# Patient Record
Sex: Female | Born: 1995 | Race: White | Hispanic: No | Marital: Married | Smoking: Never smoker
Health system: Southern US, Community
[De-identification: ages and names within clinical notes are randomized; demographics above are authoritative.]

## PROBLEM LIST (undated history)

## (undated) ENCOUNTER — Inpatient Hospital Stay: Payer: Self-pay

## (undated) DIAGNOSIS — N289 Disorder of kidney and ureter, unspecified: Secondary | ICD-10-CM

## (undated) DIAGNOSIS — N200 Calculus of kidney: Secondary | ICD-10-CM

## (undated) HISTORY — PX: NO PAST SURGERIES: SHX2092

---
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
## 2014-09-10 ENCOUNTER — Emergency Department: Payer: Self-pay | Admitting: Emergency Medicine

## 2014-09-25 NOTE — L&D Delivery Note (Addendum)
Obstetrical Delivery Note   Date of Delivery:   05/14/2015 Primary OB:   [facility] Gestational Age/EDD: [redacted]w[redacted]d (Dated by LMP=8 week ultrasoun) Antepartum complications: EFW @ 11%, AC<5%, GBS positive  Delivered By:   Cornelia Copa. MD  Delivery Type:   spontaneous vaginal delivery  Delivery Details:   Patient pushed well and with each push, decel to the 60s-70s x 1 minute with good recovery, moderate variability and back to normal baseline. Once she got to +4 and fetal heart rate stayed down x 1 minute, decision made to continue until delivery. With help of Ritgen maneuver, the fetus delivered direct OA with nuchal cord x 1 reduced after cutting the FSE and infant delivered w/o issue. Infant placed on mom's chest and due to lack of tone and cry, cord clamped and cut immediately and handed to the awaiting peds team. Placenta out via active third stage and perineum intact. Right labia minora skid mark that was bleeding so several figure of eight 3-0 vicryl rapide sutures placed for hemostasis.  Anesthesia:    epidural Intrapartum complications: ?scoliosis (epidural worked w/o issue) GBS:    Positive (she received two doses of ampicillin prior to delivery) Laceration:    Bilateral labia minora tears; only right one was repaired (see above) Episiotomy:    none Placenta:    Delivered and expressed via active management. Intact: yes. To pathology: yes.  Estimated Blood Loss:   Baby:    Liveborn female, APGARs 4/8, weight 2930gm. Per RT, unable to run venous gas, which is odd since usually it's the arterial sample that has insufficient volume but arterial showed pH of 6.99, CO2 69, bicarb 16.6, acid-base deficit 15.7  Cornelia Copa. MD [facility] Pager [phone]

## 2014-09-30 LAB — OB RESULTS CONSOLE HIV ANTIBODY (ROUTINE TESTING): HIV: NONREACTIVE

## 2014-09-30 LAB — OB RESULTS CONSOLE HEPATITIS B SURFACE ANTIGEN: Hepatitis B Surface Ag: NEGATIVE

## 2014-09-30 LAB — OB RESULTS CONSOLE ANTIBODY SCREEN: Antibody Screen: NEGATIVE

## 2014-09-30 LAB — OB RESULTS CONSOLE RPR: RPR: NONREACTIVE

## 2014-09-30 LAB — OB RESULTS CONSOLE VARICELLA ZOSTER ANTIBODY, IGG: Varicella: IMMUNE

## 2014-09-30 LAB — OB RESULTS CONSOLE ABO/RH: RH Type: POSITIVE

## 2014-09-30 LAB — OB RESULTS CONSOLE GC/CHLAMYDIA
CHLAMYDIA, DNA PROBE: NEGATIVE
Gonorrhea: NEGATIVE

## 2014-09-30 LAB — OB RESULTS CONSOLE RUBELLA ANTIBODY, IGM: RUBELLA: NON-IMMUNE/NOT IMMUNE

## 2015-02-16 ENCOUNTER — Emergency Department
Admission: EM | Admit: 2015-02-16 | Discharge: 2015-02-16 | Disposition: A | Discharge status: Home / Self Care | Payer: Medicaid Other | Attending: Emergency Medicine | Admitting: Emergency Medicine

## 2015-02-16 DIAGNOSIS — L732 Hidradenitis suppurativa: Secondary | ICD-10-CM | POA: Diagnosis not present

## 2015-02-16 DIAGNOSIS — O99712 Diseases of the skin and subcutaneous tissue complicating pregnancy, second trimester: Secondary | ICD-10-CM | POA: Insufficient documentation

## 2015-02-16 DIAGNOSIS — Z87891 Personal history of nicotine dependence: Secondary | ICD-10-CM | POA: Insufficient documentation

## 2015-02-16 DIAGNOSIS — L02419 Cutaneous abscess of limb, unspecified: Secondary | ICD-10-CM

## 2015-02-16 DIAGNOSIS — Z3A00 Weeks of gestation of pregnancy not specified: Secondary | ICD-10-CM | POA: Diagnosis not present

## 2015-02-16 DIAGNOSIS — L02411 Cutaneous abscess of right axilla: Secondary | ICD-10-CM | POA: Diagnosis not present

## 2015-02-16 MED ORDER — CLINDAMYCIN HCL 300 MG PO CAPS: 300.0000 mg | ORAL_CAPSULE | Freq: Four times a day (QID) | ORAL | Status: DC

## 2015-02-16 MED ORDER — LIDOCAINE HCL (PF) 1 % IJ SOLN: 5.0000 mL | Freq: Once | INTRAMUSCULAR | Status: DC

## 2015-02-16 MED ORDER — CEPHALEXIN 500 MG PO CAPS: 500.0000 mg | ORAL_CAPSULE | Freq: Four times a day (QID) | ORAL | Status: AC

## 2015-02-16 MED ORDER — PROMETHAZINE HCL 12.5 MG PO TABS: 12.5000 mg | ORAL_TABLET | Freq: Four times a day (QID) | ORAL | Status: DC | PRN

## 2015-02-16 MED ORDER — LIDOCAINE HCL (PF) 1 % IJ SOLN
INTRAMUSCULAR | Status: AC
  Filled 2015-02-16: qty 5

## 2015-02-16 NOTE — ED Notes (Addendum)
Patient with intact abscess to right axilla. First noted a week ago Monday. Patient is 6 months pregnant.

## 2015-02-16 NOTE — ED Notes (Signed)
Pt alert and oriented X4, active, cooperative, pt in NAD. RR even and unlabored, color WNL.  Pt informed to return if any life threatening symptoms occur.   

## 2015-02-16 NOTE — Discharge Instructions (Signed)

## 2015-02-16 NOTE — ED Provider Notes (Signed)
CSN: [id number]     Arrival date & time 02/16/15  1023 History   First MD Initiated Contact with Patient 02/16/15 1158     Chief Complaint  Patient presents with  . Abscess     (Consider location/radiation/quality/duration/timing/severity/associated sxs/prior Treatment) HPI Patient states that she's had a growing abscess in her right axilla approximately 711 week old has increased is not draining is increased in pain now to about 8 out of 10 denies fevers chills  vomiting does have some nausea nothing seems to make the pain better or worse and no other symptoms currently at this time Past Medical History  Diagnosis Date  . Renal disorder   . Kidney stone    No past surgical history on file. No family history on file. History  Substance Use Topics  . Smoking status: Former Games developermoker  . Smokeless tobacco: Not on file  . Alcohol Use: No   OB History    No data available     Review of Systems  Constitutional: Negative.   HENT: Negative.   Eyes: Negative.   Respiratory: Negative.   Cardiovascular: Negative.   Gastrointestinal: Positive for nausea.  Musculoskeletal: Negative.   All other systems reviewed and are negative.     Allergies  Review of patient's allergies indicates no known allergies.  Home Medications   Prior to Admission medications   Medication Sig Start Date End Date Taking? Authorizing Provider  clindamycin (CLEOCIN) 300 MG capsule Take 1 capsule (300 mg total) by mouth 4 (four) times daily. 02/16/15   Taleia Sadowski Kristine GarbeWilliam C Jex Strausbaugh, PA-C  HYDROcodone-acetaminophen (NORCO/VICODIN) 5-325 MG per tablet Take 1 tablet by mouth every 6 (six) hours as needed for pain. 07/08/13   Bethann BerkshireJoseph Zammit, MD  promethazine (PHENERGAN) 12.5 MG tablet Take 1 tablet (12.5 mg total) by mouth every 6 (six) hours as needed for nausea or vomiting. 02/16/15   Tremell Reimers Rosalyn GessWilliam C Halley Kincer, PA-C  promethazine (PHENERGAN) 25 MG tablet Take 1 tablet (25 mg total) by mouth every 6 (six) hours as needed for  nausea. 07/08/13   Bethann BerkshireJoseph Zammit, MD   BP 121/62 mmHg  Pulse 84  Temp(Src) 98.6 F (37 C) (Oral)  Resp 14  Ht 5\' 2"  (1.575 m)  Wt 178 lb (80.74 kg)  BMI 32.55 kg/m2  SpO2 100% Physical Exam  Gravid female appearing stated age well-developed well-nourished no acute distress Vitals reviewed Head ears eyes nose neck and throat examination of this patient was unremarkable Cardiovascular regular rate and rhythm no murmurs rubs gallops Pulmonary lungs clear to auscultation bilaterally Musculoskeletal moving all extremities without difficulty Neuro exam nonfocal cranial nerves II through XII grossly intact Skin reveals a fluctuant indurated area under her right axilla with no drainage no cellulitis to the area  ED Course  Procedures chosen and drainage of right axillary abscess area was prepped with alcohol 1% lidocaine was used for local anesthetic area was incised using an 11 blade large amount of PERRLA material expressed from wound was probed and packed using iodoform gauze patient tolerated procedure well minimal blood loss  MDM   6 months pregnant patient who had an abscess to her right axilla incised and drained today in the department will be started on clindamycin she is to follow-up in 2 days for recheck take Tylenol as needed for pain Final diagnoses:  Abscess of axilla  Hidradenitis suppurativa of right axilla        Taahir Grisby Rosalyn GessWilliam C Jamaria Amborn, PA-C 02/16/15 1309

## 2015-02-18 ENCOUNTER — Emergency Department
Admission: EM | Admit: 2015-02-18 | Discharge: 2015-02-18 | Disposition: A | Discharge status: Home / Self Care | Payer: Medicaid Other | Attending: Emergency Medicine | Admitting: Emergency Medicine

## 2015-02-18 ENCOUNTER — Encounter: Payer: Self-pay | Admitting: Emergency Medicine

## 2015-02-18 DIAGNOSIS — O99712 Diseases of the skin and subcutaneous tissue complicating pregnancy, second trimester: Secondary | ICD-10-CM | POA: Diagnosis present

## 2015-02-18 DIAGNOSIS — L0291 Cutaneous abscess, unspecified: Secondary | ICD-10-CM

## 2015-02-18 DIAGNOSIS — Z3A24 24 weeks gestation of pregnancy: Secondary | ICD-10-CM | POA: Diagnosis not present

## 2015-02-18 DIAGNOSIS — Z792 Long term (current) use of antibiotics: Secondary | ICD-10-CM | POA: Diagnosis not present

## 2015-02-18 DIAGNOSIS — L02411 Cutaneous abscess of right axilla: Secondary | ICD-10-CM | POA: Diagnosis not present

## 2015-02-18 DIAGNOSIS — Z87891 Personal history of nicotine dependence: Secondary | ICD-10-CM | POA: Diagnosis not present

## 2015-02-18 DIAGNOSIS — Z4801 Encounter for change or removal of surgical wound dressing: Secondary | ICD-10-CM | POA: Diagnosis not present

## 2015-02-18 NOTE — ED Notes (Signed)
Pt to ER for wound recheck of armpit abscess

## 2015-02-18 NOTE — ED Provider Notes (Signed)
[facility] Emergency Department Provider Note  ____________________________________________  Time seen: Approximately 1:06 PM  I have reviewed the triage vital signs and the nursing notes.   HISTORY  Chief Complaint Wound Check    HPI Alexis Ward is a 19 y.o. female presents to the ER for complaints of wound check. Patient reports that she was seen in the ER 2 days ago for an I&D of an abscess in the right axilla. Patient reports that she is here as directed. Reports that it has improved. States pain is dramatically improved and currently without pain. States continues to have mild drainage at home but none today. Patient reports continues to take antibiotics at home as prescribed. Denies fever. Reports intermittent mild nausea. But denies vomiting. Denies pain radiation. Reports overall much improved. States no pain at this time.  Reports 6 months pregnant and has neck follow-up in one week.   Past Medical History  Diagnosis Date  . Renal disorder   . Kidney stone     There are no active problems to display for this patient.   History reviewed. No pertinent past surgical history.  Current Outpatient Rx  Name  Route  Sig  Dispense  Refill  . cephALEXin (KEFLEX) 500 MG capsule   Oral   Take 1 capsule (500 mg total) by mouth 4 (four) times daily.   40 capsule   0   . HYDROcodone-acetaminophen (NORCO/VICODIN) 5-325 MG per tablet   Oral   Take 1 tablet by mouth every 6 (six) hours as needed for pain.   20 tablet   0   . promethazine (PHENERGAN) 12.5 MG tablet   Oral   Take 1 tablet (12.5 mg total) by mouth every 6 (six) hours as needed for nausea or vomiting.   20 tablet   0   . promethazine (PHENERGAN) 25 MG tablet   Oral   Take 1 tablet (25 mg total) by mouth every 6 (six) hours as needed for nausea.   15 tablet   0     Allergies Review of patient's allergies indicates no known allergies.  History reviewed. No pertinent  family history.  Social History History  Substance Use Topics  . Smoking status: Former Games developermoker  . Smokeless tobacco: Not on file  . Alcohol Use: No    Review of Systems Constitutional: No fever/chills Eyes: No visual changes. ENT: No sore throat. Cardiovascular: Denies chest pain. Respiratory: Denies shortness of breath. Gastrointestinal: No abdominal pain.  No nausea, no vomiting.  No diarrhea.  No constipation. Genitourinary: Negative for dysuria. Musculoskeletal: Negative for back pain. Skin: Negative for rash. Neurological: Negative for headaches, focal weakness or numbness.  10-point ROS otherwise negative.  ____________________________________________   PHYSICAL EXAM:  VITAL SIGNS: ED Triage Vitals  Enc Vitals Group     BP 02/18/15 1219 107/64 mmHg     Pulse Rate 02/18/15 1219 101     Resp 02/18/15 1219 18     Temp 02/18/15 1219 98.3 F (36.8 C)     Temp Source 02/18/15 1219 Oral     SpO2 02/18/15 1219 97 %     Weight 02/18/15 1219 178 lb (80.74 kg)     Height 02/18/15 1219 5\' 2"  (1.575 m)     Head Cir --      Peak Flow --      Pain Score --      Pain Loc --      Pain Edu? --  Excl. in GC? --    Blood pressure 117/52, pulse 94, temperature 98.1 F (36.7 C), temperature source Oral, resp. rate 20, height  (1.575 m), weight 178 lb (80.74 kg), SpO2 99 %.    Constitutional: Alert and oriented. Well appearing and in no acute distress. Eyes: Conjunctivae are normal. PERRL. EOMI. Head: Atraumatic. Nose: No congestion/rhinnorhea. Mouth/Throat: Mucous membranes are moist.  Oropharynx non-erythematous. Neck: No stridor.  No cervical spine tenderness to palpation. Hematological/Lymphatic/Immunilogical: No cervical lymphadenopathy. Cardiovascular: Normal rate, regular rhythm. Grossly normal heart sounds.  Good peripheral circulation. Respiratory: Normal respiratory effort.  No retractions. Lungs CTAB. Gastrointestinal: Soft and nontender, pregnant  abdomen. No CVA tenderness. Musculoskeletal: No lower extremity tenderness nor edema.  No joint effusions. Neurologic:  Normal speech and language. No gross focal neurologic deficits are appreciated. Speech is normal. No gait instability. Skin:  Skin is warm, dry and intact. No rash noted. Right axilla: healing abscess. Packing present. approx 1x2 cm mild induration, minimal erythema. No active drainage. Packing removed. No indication for repacking. No fluctuance. Nontender. No surrounding erythema, swelling or tenderness.  Psychiatric: Mood and affect are normal. Speech and behavior are normal.     PROCEDURES  Procedure(s) performed:  Area cleaned with Betadine. Area nontender. Packing removed. Patient tolerated well no active drainage. No surrounding erythema. Mild induration and minimal erythema at the base of healing abscess. No indication for repacking.  ____________________________________________   INITIAL IMPRESSION / ASSESSMENT AND PLAN / ED COURSE  Pertinent labs & imaging results that were available during my care of the patient were reviewed by me and considered in my medical decision making (see chart for details).  Very well-appearing patient. Denies fever or pain. Reports abscess is healing and feeling much better. Packing removed in ER. No indication for repacking. Discussed to continue home antibiotics,  to continue to keep clean, apply warm compresses to promote drainage. Patient has follow up with primary care physician next week. Discussed to have primary care follow-up on abscess. Discussed strict follow-up and return parameters. Patient agreed to plan. ____________________________________________    FINAL CLINICAL IMPRESSION(S) / ED DIAGNOSES  Final diagnoses:  Abscess  right axilla, subsequent with wound check    Renford Dills, NP 02/18/15 1450  Richardean Canal, MD 02/20/15 [phone]

## 2015-02-18 NOTE — Discharge Instructions (Signed)
Continue to take home antibiotics as prescribed. Keep clean. Apply warm compresses multiple times per day to promote drainage. Monitor closely as discussed.   Follow up with your primary care physician next week, and have abscess follow up.   Return to ER for increased pain, redness, swelling, new or worsening concerns.   Abscess An abscess is an infected area that contains a collection of pus and debris.It can occur in almost any part of the body. An abscess is also known as a furuncle or boil. CAUSES  An abscess occurs when tissue gets infected. This can occur from blockage of oil or sweat glands, infection of hair follicles, or a minor injury to the skin. As the body tries to fight the infection, pus collects in the area and creates pressure under the skin. This pressure causes pain. People with weakened immune systems have difficulty fighting infections and get certain abscesses more often.  SYMPTOMS Usually an abscess develops on the skin and becomes a painful mass that is red, warm, and tender. If the abscess forms under the skin, you may feel a moveable soft area under the skin. Some abscesses break open (rupture) on their own, but most will continue to get worse without care. The infection can spread deeper into the body and eventually into the bloodstream, causing you to feel ill.  DIAGNOSIS  Your caregiver will take your medical history and perform a physical exam. A sample of fluid may also be taken from the abscess to determine what is causing your infection. TREATMENT  Your caregiver may prescribe antibiotic medicines to fight the infection. However, taking antibiotics alone usually does not cure an abscess. Your caregiver may need to make a small cut (incision) in the abscess to drain the pus. In some cases, gauze is packed into the abscess to reduce pain and to continue draining the area. HOME CARE INSTRUCTIONS   Only take over-the-counter or prescription medicines for pain,  discomfort, or fever as directed by your caregiver.  If you were prescribed antibiotics, take them as directed. Finish them even if you start to feel better.  If gauze is used, follow your caregiver's directions for changing the gauze.  To avoid spreading the infection:  Keep your draining abscess covered with a bandage.  Wash your hands well.  Do not share personal care items, towels, or whirlpools with others.  Avoid skin contact with others.  Keep your skin and clothes clean around the abscess.  Keep all follow-up appointments as directed by your caregiver. SEEK MEDICAL CARE IF:   You have increased pain, swelling, redness, fluid drainage, or bleeding.  You have muscle aches, chills, or a general ill feeling.  You have a fever. MAKE SURE YOU:   Understand these instructions.  Will watch your condition.  Will get help right away if you are not doing well or get worse. Document Released: 06/21/2005 Document Revised: 03/12/2012 Document Reviewed: 11/24/2011 [facility] Patient Information 2015 [employer] [place]. This information is not intended to replace advice given to you by your health care provider. Make sure you discuss any questions you have with your health care provider.

## 2015-02-26 NOTE — OR Nursing (Signed)
Reason for anesthesia consult abnormal curvature to back.

## 2015-03-25 ENCOUNTER — Inpatient Hospital Stay
Admission: RE | Admit: 2015-03-25 | Discharge: 2015-03-25 | Disposition: A | Discharge status: Home / Self Care | Payer: Medicaid Other | Source: Ambulatory Visit

## 2015-04-16 ENCOUNTER — Observation Stay
Admission: EM | Admit: 2015-04-16 | Discharge: 2015-04-17 | Disposition: A | Discharge status: Home / Self Care | Payer: Medicaid Other | Attending: Obstetrics and Gynecology | Admitting: Obstetrics and Gynecology

## 2015-04-16 DIAGNOSIS — Z3A35 35 weeks gestation of pregnancy: Secondary | ICD-10-CM | POA: Insufficient documentation

## 2015-04-16 DIAGNOSIS — M549 Dorsalgia, unspecified: Secondary | ICD-10-CM | POA: Insufficient documentation

## 2015-04-16 DIAGNOSIS — O26893 Other specified pregnancy related conditions, third trimester: Secondary | ICD-10-CM | POA: Diagnosis not present

## 2015-04-16 LAB — URINALYSIS COMPLETE WITH MICROSCOPIC (ARMC ONLY)
Bilirubin Urine: NEGATIVE
GLUCOSE, UA: NEGATIVE mg/dL
Hgb urine dipstick: NEGATIVE
KETONES UR: NEGATIVE mg/dL
NITRITE: NEGATIVE
Protein, ur: 30 mg/dL — AB
SPECIFIC GRAVITY, URINE: 1.026 (ref 1.005–1.030)
pH: 6 (ref 5.0–8.0)

## 2015-04-17 ENCOUNTER — Encounter: Payer: Self-pay | Admitting: Obstetrics and Gynecology

## 2015-04-17 DIAGNOSIS — O26893 Other specified pregnancy related conditions, third trimester: Secondary | ICD-10-CM | POA: Diagnosis not present

## 2015-04-17 NOTE — OB Triage Provider Note (Signed)
Alexis Ward is a 19 y.o. female presenting for back pain. Maternal Medical History:  Reason for admission: Back Pain Patient presents with acute-onset right lower back pain that started yesterday evening while she was at work. The pain has been present to a milder degree for a few weeks. However, yesterday evening she began having pinching sensation that was 9/10 in pain.  The pain radiated to her front right and down her right leg.  Nothing makes it better or worse. She notes some mild pelvic pressure as an associated symptom. She denies nausea, emesis, chest pain, trouble breathing, constipation, diarrhea, vaginal symptoms such as abnormal discharge and irritative symptoms.  Contractions: None  Fetal activity: Perceived fetal activity is normal.    Prenatal complications: No bleeding.   Obesity with BMI 32 at initial visit, ?scoliosis  Prenatal Complications - Diabetes: none.    OB History    Gravida Para Term Preterm AB TAB SAB Ectopic Multiple Living   1              Past Medical History  Diagnosis Date  . Renal disorder   . Kidney stone    Past Surgical History: History reviewed. No pertinent past surgical history.   Family History: family history is not on file.   Social History:  reports that she has never smoked. She does not have any smokeless tobacco history on file. She reports that she does not drink alcohol or use illicit drugs.  Prenatal Transfer Tool  Genetic Screening: Normal Maternal Ultrasounds/Referrals: Normal Fetal Ultrasounds or other Referrals:  None Maternal Substance Abuse:  No Significant Maternal Medications:  None Significant Maternal Lab Results:  None Other Comments:  None  Review of Systems  Constitutional: Negative.   HENT: Negative.   Respiratory: Negative.   Cardiovascular: Negative.   Gastrointestinal: Negative.   Genitourinary: Negative.   Musculoskeletal: Positive for back pain.       Pain starting at right lower back  radiating around to front at pubic bone and down right leg  Skin: Negative.   Neurological: Negative.     Dilation: Closed Effacement (%): Thick Blood pressure 110/69, pulse 66, temperature 97.9 F (36.6 C), temperature source Oral, resp. rate 17. Exam Physical Exam  Constitutional: She is oriented to person, place, and time. She appears well-developed and well-nourished. No distress.  HENT:  Head: Normocephalic and atraumatic.  Neck: Normal range of motion.  Cardiovascular: Normal rate and regular rhythm.   Respiratory: Effort normal.  GI: Soft. Bowel sounds are normal. She exhibits no distension. There is no tenderness. There is no rebound.  Genitourinary:  Cervix closed/long/hi per RN  Musculoskeletal:       Right shoulder: She exhibits tenderness (along right paraspinous muscles at lower lumbar level extending down along SI joint and laterally along iliac crest to pubic bone (though much less tender anteriorly), no CVAT).  Neurological: She is alert and oriented to person, place, and time.  Skin: Skin is warm.  Psychiatric: She has a normal mood and affect. Her behavior is normal.    Prenatal labs: ABO, Rh:   A+ Antibody:  neg Rubella:  NON IMMUNE RPR:   Non-reactive HBsAg:   negative HIV:   negative GBS:   uknown  Assessment/Plan: 19 y.o. G1P0 at [redacted]w[redacted]d presenting with back pain.  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   ** Instructed on conservative measures at home such as heating pads, tylenol.  Discussed healthy and safe back activity  during pregnancy at work and home. ** if pain worsens, possible referral to PT as outpatient. She likely has scoliosis and this may be a contributing factor. ** Fetal well-being is reassuring ** follow up in one week as previously scheduled in clinic.   Conard Novak 04/17/2015, 12:40 AM

## 2015-04-20 LAB — CULTURE, BETA STREP (GROUP B ONLY)

## 2015-05-11 ENCOUNTER — Inpatient Hospital Stay
Admission: EM | Admit: 2015-05-11 | Discharge: 2015-05-16 | DRG: 775 | Disposition: A | Discharge status: Home / Self Care | Payer: Medicaid Other | Attending: Obstetrics & Gynecology | Admitting: Obstetrics & Gynecology

## 2015-05-11 DIAGNOSIS — R339 Retention of urine, unspecified: Secondary | ICD-10-CM | POA: Diagnosis not present

## 2015-05-11 DIAGNOSIS — Z3A39 39 weeks gestation of pregnancy: Secondary | ICD-10-CM | POA: Diagnosis present

## 2015-05-11 DIAGNOSIS — Z22330 Carrier of Group B streptococcus: Secondary | ICD-10-CM

## 2015-05-11 DIAGNOSIS — IMO0002 Reserved for concepts with insufficient information to code with codable children: Secondary | ICD-10-CM

## 2015-05-11 DIAGNOSIS — O365930 Maternal care for other known or suspected poor fetal growth, third trimester, not applicable or unspecified: Secondary | ICD-10-CM | POA: Diagnosis present

## 2015-05-11 DIAGNOSIS — M419 Scoliosis, unspecified: Secondary | ICD-10-CM | POA: Diagnosis present

## 2015-05-11 LAB — CHLAMYDIA/NGC RT PCR (ARMC ONLY)
Chlamydia Tr: NOT DETECTED
N GONORRHOEAE: NOT DETECTED

## 2015-05-11 LAB — TYPE AND SCREEN
ABO/RH(D): A POS
ANTIBODY SCREEN: NEGATIVE

## 2015-05-11 LAB — CBC
HCT: 37.2 % (ref 35.0–47.0)
Hemoglobin: 12.1 g/dL (ref 12.0–16.0)
MCH: 28 pg (ref 26.0–34.0)
MCHC: 32.5 g/dL (ref 32.0–36.0)
MCV: 86.2 fL (ref 80.0–100.0)
PLATELETS: 186 10*3/uL (ref 150–440)
RBC: 4.32 MIL/uL (ref 3.80–5.20)
RDW: 14.4 % (ref 11.5–14.5)
WBC: 11.6 10*3/uL — AB (ref 3.6–11.0)

## 2015-05-11 LAB — OB RESULTS CONSOLE GBS: GBS: POSITIVE

## 2015-05-11 MED ORDER — TERBUTALINE SULFATE 1 MG/ML IJ SOLN: 0.2500 mg | Freq: Once | INTRAMUSCULAR | Status: DC | PRN

## 2015-05-11 MED ORDER — ZOLPIDEM TARTRATE 5 MG PO TABS
5.0000 mg | ORAL_TABLET | Freq: Every evening | ORAL | Status: DC | PRN
  Administered 2015-05-11: 5 mg via ORAL

## 2015-05-11 MED ORDER — LACTATED RINGERS IV SOLN
INTRAVENOUS | Status: DC
  Administered 2015-05-12 – 2015-05-13 (×3): via INTRAVENOUS
  Administered 2015-05-14: 300 mL via INTRAVENOUS
  Administered 2015-05-14: 08:00:00 via INTRAVENOUS
  Administered 2015-05-14: 125 mL/h via INTRAVENOUS

## 2015-05-11 MED ORDER — DINOPROSTONE 10 MG VA INST
10.0000 mg | VAGINAL_INSERT | Freq: Once | VAGINAL | Status: AC
  Administered 2015-05-11: 10 mg via VAGINAL
  Filled 2015-05-11: qty 1

## 2015-05-11 MED ORDER — ZOLPIDEM TARTRATE 5 MG PO TABS
ORAL_TABLET | ORAL | Status: AC
  Administered 2015-05-11: 5 mg via ORAL
  Filled 2015-05-11: qty 1

## 2015-05-11 MED ORDER — SODIUM CHLORIDE 0.9 % IV SOLN
2.0000 g | Freq: Once | INTRAVENOUS | Status: AC
  Administered 2015-05-14: 2 g via INTRAVENOUS
  Filled 2015-05-11: qty 2000

## 2015-05-12 LAB — ABO/RH: ABO/RH(D): A POS

## 2015-05-12 MED ORDER — MISOPROSTOL 25 MCG QUARTER TABLET
25.0000 ug | ORAL_TABLET | ORAL | Status: DC
  Administered 2015-05-12 – 2015-05-13 (×6): 25 ug via ORAL
  Filled 2015-05-12: qty 1
  Filled 2015-05-12: qty 0.25
  Filled 2015-05-12 (×2): qty 1
  Filled 2015-05-12: qty 0.25
  Filled 2015-05-12: qty 1

## 2015-05-12 NOTE — Progress Notes (Signed)
S: Feeling some cramping in her back and her thighs. Ate some supper. Received second dose of Cytotec at 1600. Delayed next dose of Cytotec until she was able to eat/ walk around/ shower O: Filed Vitals:   05/12/15 1557 05/12/15 1559 05/12/15 1810 05/12/15 2252  BP: 136/83 126/74 120/73 137/91  Pulse: 86 80 66 76  Temp:  98 F (36.7 C)    TempSrc:  Oral    Resp:  18    Height:      Weight:         Gen: NAD, AAOx3           Ext: Non-tender, Nonedematous    FHT: 150 mod var + accelerations no decelerations TOCO: mild irregular SVE:1/60%/-1 at 2030   A/P:  19 y.o. yo G1P0 at [redacted]w[redacted]d for IOL for FGR   Little progress thus far- third dose of Cytotec given now. Will monitor over night and reevaluate POM in Am if not in labor  FWB: Reassuring Cat 1 tracing all day  GBS: positive-have not started GBS PPX yet     Alexis Ward 12:00 AM

## 2015-05-12 NOTE — Progress Notes (Signed)
Date of Initial H&P: 8 August  History reviewed, patient examined, no change in status, stable for procedure.  19 year old G1 with EDC=05/18/2015 by LMP=8wk ultrasound presented last night at 39.1 weeks for IOL for FGR. Ultrasound 04/23/2015 with EFW 5#3oz (10.8%)with AC<5th%. Antepartum testing has been reassuring. Last AFI 8/15 was 6.67cm with normal Dopplers. Patient received cervidil last night. A POS/ RNI/ VI/ GBS positive  Exam: 120/75 FHR 115-120 with accelerations to 140s, moderate variability Toco: occasional ctx EFW:6 1/2#/ vtx Cervix: closed/30-40%/-3  A: IUP at 39.1 weeks with suspected FGR for IOL Unripe cervix Reassuring FHR tracing-CAt 1 GBS positive  P: Plan for Cytotec due to unripe cervix. 25 mcg q4 hours po Discussed plan and reviewed risks of hyperstimulation, FITL, failed IOL, Cesarean section. Patient verbalizes understanding and wishes to proceed Can shower and eat prior to starting Cytotec Will start GBS PPX when becomes active.  Farrel Conners, CNM

## 2015-05-13 ENCOUNTER — Inpatient Hospital Stay: Payer: Medicaid Other

## 2015-05-13 LAB — RPR: RPR Ser Ql: NONREACTIVE

## 2015-05-13 MED ORDER — SODIUM CHLORIDE 0.9 % IJ SOLN
INTRAMUSCULAR | Status: AC
  Filled 2015-05-13: qty 3

## 2015-05-13 MED ORDER — BUTORPHANOL TARTRATE 1 MG/ML IJ SOLN
1.0000 mg | Freq: Once | INTRAMUSCULAR | Status: AC
  Administered 2015-05-13: 1 mg via INTRAVENOUS
  Filled 2015-05-13: qty 1

## 2015-05-13 NOTE — Progress Notes (Signed)
L&D Note  05/13/2015 - 1:00 PM  19 y.o. G1P0 [redacted]w[redacted]d   [name] is admitted for IOL for FGR   Subjective:  Pt denies feeling regular ctx.   Objective:   Filed Vitals:   05/13/15 0625 05/13/15 0857 05/13/15 1255 05/13/15 1259  BP: 123/76 117/72  129/84  Pulse: 65 79  68  Temp:  98.4 F (36.9 C) 97.7 F (36.5 C)   TempSrc:  Oral Oral   Resp:   18   Height:      Weight:        Current Vital Signs 24h Vital Sign Ranges  T 97.7 F (36.5 C) Temp  Avg: 98 F (36.7 C)  Min: 97.7 F (36.5 C)  Max: 98.4 F (36.9 C)  BP 129/84 mmHg BP  Min: 117/72  Max: 137/91  HR 68 Pulse  Avg: 74.3  Min: 65  Max: 86  RR 18 Resp  Avg: 18  Min: 18  Max: 18  SaO2     No Data Recorded       24 Hour I/O Current Shift I/O  Time Ins Outs   08/18 0701 - 08/18 1900 In: 480 [P.O.:480] Out: -    Growth [place] this am: 11%, but AC <5%. Dr Fayrene Fearing rec continuing with POM for IOL.   FHR: category 1, baseline 130, mod variability, + accels, no decels Toco: rare SVE: 1.5/80/-2   Assessment :  IUP at [redacted]w[redacted]d, FHR    Plan:  Foley bulb placed, balloon filled with 30 cc sterile water and traction applied.  Consider Pitocin   Alexis Ward, [place], [place]

## 2015-05-13 NOTE — Procedures (Signed)
Indication: IUGR.  ____________________________________________________________________________ History: Age: 19 years. ____________________________________________________________________________ Dating: Stated EDC:  EDC: 05/17/2015 GA by stated EDC: [redacted]w[redacted]d Current Scan on: 05/13/2015 EDC: 06/04/2015 GA by current scan: [redacted]w[redacted]d Best Overall Assessment: 05/13/2015 EDC: 05/17/2015 Assessed GA: [redacted]w[redacted]d The calculation of the gestational age by current scan was based on BPD, HC, AC, FL and HUM. The Best Overall Assessment is based on the stated [facility]. ____________________________________________________________________________ Anatomy Scan: Singleton gestation. Biometry: BPD 93.3 mm  - [redacted]w[redacted]d ([redacted]w[redacted]d to [redacted]w[redacted]d) HC 336.3 mm  - [redacted]w[redacted]d ([redacted]w[redacted]d to [redacted]w[redacted]d) AC 322.0 mm  - [redacted]w[redacted]d (104w1d to [redacted]w[redacted]d) FL 70.5 mm  - [redacted]w[redacted]d ([redacted]w[redacted]d to [redacted]w[redacted]d) HUM 60.6 mm  - [redacted]w[redacted]d EFW (lbs/oz) 6 lbs 8 ozs EFW (g) 2952 g 11th%   Fetal heart activity: present. Fetal heart rate: 132 bpm.  Fetal presentation: cephalic.  Amniotic fluid: normal. AFI  12.4 cm.  Placenta: posterior.   Fetal Anatomy: Neck / Skin: Appears normal.  Thorax: Appears normal.  Heart: Appears normal.  Gastrointestinal Tract: Appears normal.  Kidneys / Adrenal Glands: Appears normal.  Bladder: Appears normal.  Skeleton: Appears normal.  ____________________________________________________________________________ Doppler: Fetal Doppler: Umbilical Artery: S/D ratio 2.34   .  ____________________________________________________________________________ Report Summary: Impression: Thank you for referring your patient for limited ultrasound to evalaute fetal growth.  She is undergoing IOL for size<dates.    The estimated fetal weight is 2952g (11%).  The fetal AC measures below the 5th percentile for gestational age.  The amniotic fluid volume is normal and active fetal movements are seen.     The umbilical artery s/d ratio is normal.  Third trimester  ultrasound measurements have a wide standard deviation.  Given the fetal AC of less than the 5th percentile, it is reasonable to continue the induction of labor. CODING DESCRIPTION: history of iugr.  Recommendations:   Thank you for allowing [place] to participate in her care.

## 2015-05-14 ENCOUNTER — Encounter: Payer: Self-pay | Admitting: Anesthesiology

## 2015-05-14 ENCOUNTER — Inpatient Hospital Stay: Payer: Medicaid Other | Admitting: Anesthesiology

## 2015-05-14 LAB — PLATELET COUNT: PLATELETS: 163 10*3/uL (ref 150–440)

## 2015-05-14 MED ORDER — FENTANYL CITRATE (PF) 100 MCG/2ML IJ SOLN: 50.0000 ug | Freq: Once | INTRAMUSCULAR | Status: DC

## 2015-05-14 MED ORDER — TERBUTALINE SULFATE 1 MG/ML IJ SOLN: 0.2500 mg | Freq: Once | INTRAMUSCULAR | Status: DC | PRN

## 2015-05-14 MED ORDER — OXYTOCIN 40 UNITS IN LACTATED RINGERS INFUSION - SIMPLE MED
INTRAVENOUS | Status: AC
Start: 2015-05-14 — End: 2015-05-14
  Administered 2015-05-14: 1 m[IU]/min via INTRAVENOUS
  Filled 2015-05-14: qty 1000

## 2015-05-14 MED ORDER — SODIUM CHLORIDE 0.9 % IV SOLN
INTRAVENOUS | Status: AC
  Administered 2015-05-14: 1 g
  Filled 2015-05-14: qty 1000

## 2015-05-14 MED ORDER — BUPIVACAINE HCL (PF) 0.25 % IJ SOLN
INTRAMUSCULAR | Status: DC | PRN
  Administered 2015-05-14: 5 mL

## 2015-05-14 MED ORDER — OXYTOCIN 40 UNITS IN LACTATED RINGERS INFUSION - SIMPLE MED
1.0000 m[IU]/min | INTRAVENOUS | Status: DC
  Administered 2015-05-14: 999 mL/h via INTRAVENOUS
  Administered 2015-05-14: 1 m[IU]/min via INTRAVENOUS

## 2015-05-14 MED ORDER — FENTANYL CITRATE (PF) 100 MCG/2ML IJ SOLN
50.0000 ug | INTRAMUSCULAR | Status: DC | PRN
  Administered 2015-05-14 (×2): 50 ug via INTRAVENOUS
  Filled 2015-05-14 (×2): qty 2

## 2015-05-14 MED ORDER — SODIUM CHLORIDE 0.9 % IV SOLN
1.0000 g | INTRAVENOUS | Status: DC
  Filled 2015-05-14 (×3): qty 1000

## 2015-05-14 MED ORDER — FENTANYL 2.5 MCG/ML W/ROPIVACAINE 0.2% IN NS 100 ML EPIDURAL INFUSION (ARMC-ANES)
EPIDURAL | Status: AC
  Administered 2015-05-14: 10 mL/h via EPIDURAL
  Filled 2015-05-14: qty 100

## 2015-05-14 MED ORDER — MEASLES, MUMPS & RUBELLA VAC ~~LOC~~ INJ
0.5000 mL | INJECTION | Freq: Once | SUBCUTANEOUS | Status: AC
  Administered 2015-05-16: 0.5 mL via SUBCUTANEOUS
  Filled 2015-05-14 (×2): qty 0.5

## 2015-05-14 MED ORDER — BUTORPHANOL TARTRATE 1 MG/ML IJ SOLN
INTRAMUSCULAR | Status: AC
  Filled 2015-05-14: qty 2

## 2015-05-14 NOTE — Discharge Summary (Addendum)
Obstetrical Discharge Summary  Date of Admission: 05/11/2015 Date of Discharge: 05/16/2015  Primary OB: [facility]  Gestational Age at Delivery: [redacted]w[redacted]d   Antepartum complications: EFW 11%, with AC <5% Reason for Admission: IOL for small [facility] Date of Delivery: 05/16/2015 Delivered By: Cornelia Copa. MD Delivery Type: spontaneous vaginal delivery Intrapartum complications/course: ?scoliosis (epidural worked w/o issue) Anesthesia: epidural Placenta: expressed. Intact: yes. To pathology: yes.  Laceration: bilateral labia minora; only right repaired with figure of eight 3-0 vicryl rapide suture Episiotomy: none Baby: Liveborn female, APGARs4/8, weight 2930 g. Per RT, unable to run venous gas, which is odd since usually it's the arterial sample that has insufficient volume but arterial showed pH of 6.99, CO2 69, bicarb 16.6, acid-base deficit 15.7  Postpartum course: Postpartum course notable for urinary retention requiring indwelling foley catheter for 24-hrs, deemed secondary to edema with resolution prior to discharge.  Otherwise hemodynamically stable, afebrile, and uncomplicated postpartum course Disposition: Home  Rh Immune globulin given: not applicable Rubella vaccine given: ordered PP Tdap vaccine given in AP or PP setting: yes Flu vaccine given in AP or PP setting: not applicable  Contraception: Depo Provera  Prenatal/Postnatal Panel: A POS//Rubella Not immune//Varicella Immune//RPR negative//HIV negative/HepB Surface Ag negative//pap not applicable //plans to bottle feed  Plan:  NOELE ICENHOUR was discharged to home in good condition. Follow-up appointment with [place] Bing in 6 week  Discharge Medications:   Medication List    STOP taking these medications        HYDROcodone-acetaminophen 5-325 MG per tablet  Commonly known as:  NORCO/VICODIN     promethazine 12.5 MG tablet  Commonly known as:  PHENERGAN     promethazine 25 MG tablet  Commonly known as:   PHENERGAN      TAKE these medications        ibuprofen 600 MG tablet  Commonly known as:  ADVIL,MOTRIN  Take 1 tablet (600 mg total) by mouth every 6 (six) hours.     prenatal multivitamin Tabs tablet  Take 1 tablet by mouth daily at 12 noon.

## 2015-05-14 NOTE — Discharge Instructions (Signed)
Vaginal Delivery, Care After Refer to this sheet in the next few weeks. These discharge instructions provide you with information on caring for yourself after delivery. Your caregiver may also give you specific instructions. Your treatment has been planned according to the most current medical practices available, but problems sometimes occur. Call your caregiver if you have any problems or questions after you go home. HOME CARE INSTRUCTIONS 1. Take over-the-counter or prescription medicines only as directed by your caregiver or pharmacist. 2. Do not drink alcohol, especially if you are breastfeeding or taking medicine to relieve pain. 3. Do not smoke tobacco. 4. Continue to use good perineal care. Good perineal care includes: 1. Wiping your perineum from back to front 2. Keeping your perineum clean. 3. You can do sitz baths twice a day, to help keep this area clean 5. Do not use tampons, douche or have sex until your caregiver says it is okay. 6. Shower only and avoid sitting in submerged water, aside from sitz baths 7. Wear a well-fitting bra that provides breast support. 8. Eat healthy foods. 9. Drink enough fluids to keep your urine clear or pale yellow. 10. Eat high-fiber foods such as whole grain cereals and breads, brown rice, beans, and fresh fruits and vegetables every day. These foods may help prevent or relieve constipation. 11. Avoid constipation with high fiber foods or medications, such as miralax or metamucil 12. Follow your caregiver's recommendations regarding resumption of activities such as climbing stairs, driving, lifting, exercising, or traveling. 13. Talk to your caregiver about resuming sexual activities. Resumption of sexual activities is dependent upon your risk of infection, your rate of healing, and your comfort and desire to resume sexual activity. 14. Try to have someone help you with your household activities and your newborn for at least a few days after you leave  the hospital. 15. Rest as much as possible. Try to rest or take a nap when your newborn is sleeping. 16. Increase your activities gradually. 17. Keep all of your scheduled postpartum appointments. It is very important to keep your scheduled follow-up appointments. At these appointments, your caregiver will be checking to make sure that you are healing physically and emotionally. SEEK MEDICAL CARE IF:   You are passing large clots from your vagina. Save any clots to show your caregiver.  You have a foul smelling discharge from your vagina.  You have trouble urinating.  You are urinating frequently.  You have pain when you urinate.  You have a change in your bowel movements.  You have increasing redness, pain, or swelling near your vaginal incision (episiotomy) or vaginal tear.  You have pus draining from your episiotomy or vaginal tear.  Your episiotomy or vaginal tear is separating.  You have painful, hard, or reddened breasts.  You have a severe headache.  You have blurred vision or see spots.  You feel sad or depressed.  You have thoughts of hurting yourself or your newborn.  You have questions about your care, the care of your newborn, or medicines.  You are dizzy or light-headed.  You have a rash.  You have nausea or vomiting.  You were breastfeeding and have not had a menstrual period within 12 weeks after you stopped breastfeeding.  You are not breastfeeding and have not had a menstrual period by the 12th week after delivery.  You have a fever. SEEK IMMEDIATE MEDICAL CARE IF:   You have persistent pain.  You have chest pain.  You have shortness of breath.    You faint.  You have leg pain.  You have stomach pain.  Your vaginal bleeding saturates two or more sanitary pads in 1 hour. MAKE SURE YOU:   Understand these instructions.  Will watch your condition.  Will get help right away if you are not doing well or get worse. Document Released:  09/08/2000 Document Revised: 01/26/2014 Document Reviewed: 05/08/2012 ExitCare Patient Information 2015 [employer]. This information is not intended to replace advice given to you by your health care provider. Make sure you discuss any questions you have with your health care provider.  Sitz Bath A sitz bath is a warm water bath taken in the sitting position. The water covers only the hips and butt (buttocks). We recommend using one that fits in the toilet, to help with ease of use and cleanliness. It may be used for either healing or cleaning purposes. Sitz baths are also used to relieve pain, itching, or muscle tightening (spasms). The water may contain medicine. Moist heat will help you heal and relax.  HOME CARE  Take 3 to 4 sitz baths a day. 18. Fill the bathtub half-full with warm water. 19. Sit in the water and open the drain a little. 20. Turn on the warm water to keep the tub half-full. Keep the water running constantly. 21. Soak in the water for 15 to 20 minutes. 22. After the sitz bath, pat the affected area dry. GET HELP RIGHT AWAY IF: You get worse instead of better. Stop the sitz baths if you get worse. MAKE SURE YOU:  Understand these instructions.  Will watch your condition.  Will get help right away if you are not doing well or get worse. Document Released: 10/19/2004 Document Revised: 06/05/2012 Document Reviewed: 01/09/2011 ExitCare Patient Information 2015 [employer]. This information is not intended to replace advice given to you by your health care provider. Make sure you discuss any questions you have with your health care provider.    

## 2015-05-14 NOTE — Anesthesia Procedure Notes (Signed)
Epidural Patient location during procedure: OB  Staffing Anesthesiologist: Berdine Addison Performed by: anesthesiologist   Preanesthetic Checklist Completed: patient identified, site marked, surgical consent, pre-op evaluation, timeout performed, IV checked, risks and benefits discussed and monitors and equipment checked  Epidural Patient position: sitting Prep: Betadine Patient monitoring: heart rate, continuous pulse ox and blood pressure Approach: midline Location: L4-L5 Injection technique: LOR saline  Needle:  Needle type: Tuohy  Needle gauge: 18 G Needle length: 9 cm and 9 Catheter type: closed end flexible Catheter size: 20 Guage Test dose: negative and 1.5% lidocaine with Epi 1:200 K  Assessment Sensory level: T10 Events: blood not aspirated, injection not painful, no injection resistance, negative IV test and no paresthesia  Additional Notes   Patient tolerated the insertion well without complications. 1438 catheter placed. 1439 3ml test dose. 1441 5 ml bolus .25% marcaine. 1450 infusion.Reason for block:procedure for pain

## 2015-05-14 NOTE — Progress Notes (Signed)
L&D Note  05/14/2015 - 6:48 PM  19 y.o. G1 [redacted]w[redacted]d (LMP=7wk u/s). Pregnancy complicated by small AC, GBS pos, ? H/o scoliosis  [name] is admitted for IOL for small AC   Subjective:  Doing well; doesn't feel UCs  Objective:    Current Vital Signs 24h Vital Sign Ranges  T 97.8 F (36.6 C) Temp  Avg: 98.3 F (36.8 C)  Min: 97.8 F (36.6 C)  Max: 98.7 F (37.1 C)  BP (!) 110/49 mmHg BP  Min: 100/61  Max: 147/74  HR 65 Pulse  Avg: 67.6  Min: 58  Max: 95  RR 16 Resp  Avg: 17.6  Min: 16  Max: 18  SaO2    98/RA No Data Recorded       24 Hour I/O Current Shift I/O  Time Ins Outs 08/18 0701 - 08/19 0700 In: 480 [P.O.:480] Out: -  08/19 0701 - 08/19 1900 In: 5491.1 [I.V.:5491.1] Out: -    FHR: 120 baseline, +accels, occasional slight varials decels, mod variabilty Toco: q3-63m Gen: NAD SVE: deferred  Labs:   Recent Labs Lab 05/11/15 2137 05/14/15 1312  WBC 11.6*  --   HGB 12.1  --   HCT 37.2  --   PLT 186 163   Medications Current Facility-Administered Medications  Medication Dose Route Frequency Provider Last Rate Last Dose  . butorphanol (STADOL) 1 MG/ML injection           . fentaNYL (SUBLIMAZE) injection 50 mcg  50 mcg Intravenous Q1H PRN [place] Bing, MD   50 mcg at 05/14/15 1333  . fentaNYL (SUBLIMAZE) injection 50 mcg  50 mcg Intravenous Once [place] Bing, MD      . lactated ringers infusion   Intravenous Continuous [place] Bing, MD 125 mL/hr at 05/14/15 1600 300 mL at 05/14/15 1600  . oxytocin (PITOCIN) IV infusion 40 units in LR 1000 mL  1-40 milli-units/min Intravenous Titrated Elenora Fender Ward, MD 9 mL/hr at 05/14/15 1717 6 milli-units/min at 05/14/15 1717  . terbutaline (BRETHINE) injection 0.25 mg  0.25 mg Subcutaneous Once PRN [place] Bing, MD      . terbutaline (BRETHINE) injection 0.25 mg  0.25 mg Subcutaneous Once PRN [place] Fender Ward, MD      . zolpidem (AMBIEN) tablet 5 mg  5 mg Oral QHS PRN Nadara Mustard, MD   5 mg at 05/11/15 2317    Facility-Administered Medications Ordered in Other Encounters  Medication Dose Route Frequency Provider Last Rate Last Dose  . bupivacaine (PF) (MARCAINE) 0.25 % injection    Anesthesia Intra-op Berdine Addison, MD   5 mL at 05/14/15 1441    Assessment & Plan:  Pt doing well *IUP: category II tracing but with non recurrent slight variables, normal baseline, accels and mod variability; fetal status reassuring *IOL: continue with pit per protocol; currently at 6. 6/90/0 @ 1745 *GBS: 2nd dose of amp due in a few minutes *Analgesia: s/p epidural and working well  [place] Bing, Montez Hageman MD [employer] Pager [phone]

## 2015-05-14 NOTE — Progress Notes (Addendum)
L&D Note  05/14/2015 - 8:28 AM  19 y.o. G1 [redacted]w[redacted]d (LMP=7wk u/s). Pregnancy complicated by small AC, GBS pos, ? H/o scoliosis  [name] is admitted for IOL for small AC   Subjective:  No complaints. Pt doing well  Objective:    Current Vital Signs 24h Vital Sign Ranges  T 97.8 F (36.6 C) Temp  Avg: 98.2 F (36.8 C)  Min: 97.7 F (36.5 C)  Max: 98.7 F (37.1 C)  BP 123/64 mmHg BP  Min: 100/61  Max: 130/76  HR (!) 58 Pulse  Avg: 67.8  Min: 58  Max: 82  RR 16 Resp  Avg: 17.7  Min: 16  Max: 18  SaO2    98/RA No Data Recorded       24 Hour I/O Current Shift I/O  Time Ins Outs 08/18 0701 - 08/19 0700 In: 480 [P.O.:480] Out: -  08/19 0701 - 08/19 1900 In: 5491.1 [I.V.:5491.1] Out: -    FHR: 125 baseline, +accels, no decels, mod variabilty Toco: q3-23m Gen: NAD SVE: deferred  Labs:   Recent Labs Lab 05/11/15 2137  WBC 11.6*  HGB 12.1  HCT 37.2  PLT 186   Medications Current Facility-Administered Medications  Medication Dose Route Frequency Provider Last Rate Last Dose  . ampicillin (OMNIPEN) 2 g in sodium chloride 0.9 % 50 mL IVPB  2 g Intravenous Once [place] Bing, MD      . lactated ringers infusion   Intravenous Continuous [place] Bing, MD 125 mL/hr at 05/14/15 0809    . misoprostol (CYTOTEC) tablet 25 mcg  25 mcg Oral 6 times per day Farrel Conners, CNM   25 mcg at 05/13/15 2226  . oxytocin (PITOCIN) IV infusion 40 units in LR 1000 mL  1-40 milli-units/min Intravenous Titrated [place] Ward, MD 13.5 mL/hr at 05/14/15 0800 9 milli-units/min at 05/14/15 0800  . terbutaline (BRETHINE) injection 0.25 mg  0.25 mg Subcutaneous Once PRN [place] Bing, MD      . terbutaline (BRETHINE) injection 0.25 mg  0.25 mg Subcutaneous Once PRN [place] C Ward, MD      . zolpidem (AMBIEN) tablet 5 mg  5 mg Oral QHS PRN Nadara Mustard, MD   5 mg at 05/11/15 2317    Assessment & Plan:  Pt doing well *IUP: category I tracing, fetal status reassuring *IOL:  continue with pit per protocol; currently at 9. SVE later this AM/early afternoon; consider AROM or give one dose of abx and then can do it *GBS: start abx in active labor *Analgesia: no needs; will have anesthesia come by and assess for future epidural  Cornelia Copa MD [facility] OBGYN Pager [phone]

## 2015-05-14 NOTE — Progress Notes (Signed)
L&D Progress Note:   S: Comfortable with epidural after epidural bolused-took care of hot spot on left hip  O: BP 110/49 mmHg  Pulse 65  Temp(Src) 97.8 F (36.6 C) (Oral)  Resp 16  Ht  (1.651 m)  Wt 182 lb (82.555 kg)  BMI 30.29 kg/m2  LMP 08/11/2014   SROM at 1420 for clear fluid GBS PPX started after SROM Pitocin decreased to 65miu/min after SROM (was at 19 miu) IUPC inserted at 1533 at which time cx was 4//90%/-1 Contractions were frequent but not very strong, so Pitocin was decreased to 4 miu/min to allow them to space out and it was then slowly titrated back up and is currently at 70miu/min. Contractions q2-3, and despite inadequate mvus cervix at 1745 was 6cm/90%/0 FHR baseline at 115 to 120 with accels, mod variability and  +scalp stimulation  Also Was having 10-20 sec variable decelerations with contractions earlier and was switched to right side and then had a couple of late subtle decelerations, so was turned back to left side. Urine output was adequate A:  Progressing, despite inadequate mvus Occasionally CAt2 tracing  P: Continue to monitor fetal well being and patient progress Continue Pitocin to keep contractions q2-3 minutes apart  Koula Venier, CNM

## 2015-05-14 NOTE — Progress Notes (Signed)
Pt has been pushing effectively.   Newborn delivered at 2130 with nuchal cord and terminal mec.  Cord clamped and handed off to nursery personal on standby.

## 2015-05-15 MED ORDER — ONDANSETRON HCL 4 MG PO TABS: 4.0000 mg | ORAL_TABLET | ORAL | Status: DC | PRN

## 2015-05-15 MED ORDER — IBUPROFEN 600 MG PO TABS
600.0000 mg | ORAL_TABLET | Freq: Four times a day (QID) | ORAL | Status: DC
  Administered 2015-05-15 – 2015-05-16 (×6): 600 mg via ORAL
  Filled 2015-05-15 (×6): qty 1

## 2015-05-15 MED ORDER — OXYCODONE-ACETAMINOPHEN 5-325 MG PO TABS: 1.0000 | ORAL_TABLET | ORAL | Status: DC | PRN

## 2015-05-15 MED ORDER — DIPHENHYDRAMINE HCL 25 MG PO CAPS: 25.0000 mg | ORAL_CAPSULE | Freq: Four times a day (QID) | ORAL | Status: DC | PRN

## 2015-05-15 MED ORDER — DIBUCAINE 1 % RE OINT: 1.0000 "application " | TOPICAL_OINTMENT | RECTAL | Status: DC | PRN

## 2015-05-15 MED ORDER — PRENATAL MULTIVITAMIN CH
1.0000 | ORAL_TABLET | Freq: Every day | ORAL | Status: DC
  Administered 2015-05-15 – 2015-05-16 (×2): 1 via ORAL
  Filled 2015-05-15 (×2): qty 1

## 2015-05-15 MED ORDER — SIMETHICONE 80 MG PO CHEW: 80.0000 mg | CHEWABLE_TABLET | ORAL | Status: DC | PRN

## 2015-05-15 MED ORDER — LANOLIN HYDROUS EX OINT: TOPICAL_OINTMENT | CUTANEOUS | Status: DC | PRN

## 2015-05-15 MED ORDER — FENTANYL CITRATE (PF) 100 MCG/2ML IJ SOLN
50.0000 ug | Freq: Once | INTRAMUSCULAR | Status: AC
  Administered 2015-05-15: 50 ug via INTRAVENOUS
  Filled 2015-05-15: qty 2

## 2015-05-15 MED ORDER — LACTATED RINGERS IV SOLN
INTRAVENOUS | Status: DC
  Administered 2015-05-15: 06:00:00 via INTRAVENOUS

## 2015-05-15 MED ORDER — ONDANSETRON HCL 4 MG/2ML IJ SOLN: 4.0000 mg | INTRAMUSCULAR | Status: DC | PRN

## 2015-05-15 MED ORDER — DOCUSATE SODIUM 100 MG PO CAPS
100.0000 mg | ORAL_CAPSULE | Freq: Two times a day (BID) | ORAL | Status: DC
  Administered 2015-05-15 – 2015-05-16 (×4): 100 mg via ORAL
  Filled 2015-05-15 (×4): qty 1

## 2015-05-15 MED ORDER — WITCH HAZEL-GLYCERIN EX PADS: 1.0000 "application " | MEDICATED_PAD | CUTANEOUS | Status: DC | PRN

## 2015-05-15 MED ORDER — ACETAMINOPHEN 325 MG PO TABS: 650.0000 mg | ORAL_TABLET | ORAL | Status: DC | PRN

## 2015-05-15 MED ORDER — BENZOCAINE-MENTHOL 20-0.5 % EX AERO: 1.0000 "application " | INHALATION_SPRAY | CUTANEOUS | Status: DC | PRN

## 2015-05-15 NOTE — Progress Notes (Signed)
Per verbal order from Dr. Bonney Aid can d/c IV fluids

## 2015-05-15 NOTE — Progress Notes (Signed)
Pt assisted to bathroom to void.  Could not at this time.   FF and not deviated at this time.  Shown pericare, clean pads, icepack and gown.   Transferred via w/c to PP rm 342.

## 2015-05-15 NOTE — Progress Notes (Signed)
OB Note CTSP regarding inability to void  Patient has been up a few times and unable to void each time. Bladder scanned for 300-461mL. RN attempted to place foley but difficult due to edema.  14 french foley placed. Labia still moderately swollen and right sutures intact and skid marks (not repaired) appeared hemostatic. Immediate return of moderately concentrated urine with at least by the time I left the room. Will leave foley in for at least 24hrs and only remove if okay with MD. Pt amenable to plan.   Cornelia Copa MD [facility]  Pager: [phone]

## 2015-05-15 NOTE — Anesthesia Postprocedure Evaluation (Signed)
  Anesthesia Post-op Note  Patient: Alexis Ward  Procedure(s) Performed: * No procedures listed *  Anesthesia type:No value filed.  Patient location: PACU  Post pain: Pain level controlled  Post assessment: Post-op Vital signs reviewed, Patient's Cardiovascular Status Stable, Respiratory Function Stable, Patent Airway and No signs of Nausea or vomiting  Post vital signs: Reviewed and stable  Last Vitals:  Filed Vitals:   05/15/15 0351  BP: 130/70  Pulse: 88  Temp: 37.1 C  Resp: 18    Level of consciousness: awake, alert  and patient cooperative  Complications: No apparent anesthesia complications

## 2015-05-15 NOTE — Anesthesia Post-op Follow-up Note (Signed)
  Anesthesia Pain Follow-up Note  Patient: Alexis Ward  Day #: 1  Date of Follow-up: 05/15/2015 Time: 7:39 AM  Last Vitals:  Filed Vitals:   05/15/15 0351  BP: 130/70  Pulse: 88  Temp: 37.1 C  Resp: 18    Level of Consciousness: alert  Pain: none   Side Effects:None  Catheter Site Exam:clean, dry, no drainage  Plan: D/C from anesthesia care  Basilio Cairo

## 2015-05-15 NOTE — Progress Notes (Signed)
Attempt to in and out cath pt (after speaking to dr. Vergie Living); it had been 8 hours since delivery at 0530 on 05-15-15; pt unable to void; bladder scan showed of urine; dr. Vergie Living wanted RN to in and out cath pt and have pt attempt to void over the next 8 hours; RN attempt x2 to in and out cath; vaginal edema, vaginal sutures and superficial inner labial lacerations present; dr. Vergie Living to come and place indwelling catheter

## 2015-05-15 NOTE — Anesthesia Preprocedure Evaluation (Addendum)
Anesthesia Evaluation  Patient identified by MRN, date of birth, ID band Patient awake    Reviewed: Allergy & Precautions, H&P , NPO status , Patient's Chart, lab work & pertinent test results  History of Anesthesia Complications Negative for: history of anesthetic complications  Airway Mallampati: I  TM Distance: >3 FB Neck ROM: Full    Dental no notable dental hx. (+) Teeth Intact   Pulmonary  breath sounds clear to auscultation  Pulmonary exam normal       Cardiovascular Normal cardiovascular examRhythm:regular Rate:Normal     Neuro/Psych    GI/Hepatic   Endo/Other    Renal/GU Renal disease (kidney stone)     Musculoskeletal   Abdominal Normal abdominal exam  (+)   Peds  Hematology   Anesthesia Other Findings   Reproductive/Obstetrics (+) Pregnancy                           Anesthesia Physical Anesthesia Plan  ASA: II  Anesthesia Plan: Epidural   Post-op Pain Management:    Induction:   Airway Management Planned:   Additional Equipment:   Intra-op Plan:   Post-operative Plan:   Informed Consent:   Plan Discussed with: Anesthesiologist  Anesthesia Plan Comments:        Anesthesia Quick Evaluation

## 2015-05-15 NOTE — Progress Notes (Signed)
  Subjective:  Doing well, foley replaced this AM for inability to void secondary to edema.  Lochia minimal  Objective:   Blood pressure 116/71, pulse 75, temperature 97.9 F (36.6 C), temperature source Oral, resp. rate 16, height  (1.651 m), weight 82.555 kg (182 lb), last menstrual period 08/11/2014, SpO2 100 %, unknown if currently breastfeeding.  General: NAD Pulmonary: no increased work of breathing Abdomen: non-distended, non-tender, fundus firm at level of umbilicus Extremities: no edema, no erythema, no tenderness  Results for orders placed or performed during the hospital encounter of 05/11/15 (from the past 72 hour(s))  Platelet count     Status: None   Collection Time: 05/14/15  1:12 PM  Result Value Ref Range   Platelets 163 150 - 440 K/uL  Blood Gas, Cord     Status: Abnormal (Preliminary result)   Collection Time: 05/14/15  9:48 PM  Result Value Ref Range   pH cord blood 6.99 (LL) 7.210 - 7.380    Comment: CRITICAL RESULT CALLED TO, READ BACK BY AND VERIFIED WITH: SUSAN RN @ 2200 8/1//2016  JCG SAMPLE LABELED CORD ARTERIAL    pCO2 cord blood 69.0 (H) 42.0 - 56.0 mmHg   pO2 cord blood PENDING mmHg   Bicarbonate 16.6 (L) 21.0 - 28.0 mEq/L   Acid-base deficit 15.7 (H) 0.0 - 2.0 mmol/L    Assessment:   19 y.o. G1P1001 postpartum day # 1 TSVD IOL IUGR  Plan:  1) Urinary retention - foley catheter for first 24hrs postpartum.  Otherwise routine postpartum care  2) --/--/A POS, A POS (08/16 2137) Ishmael Holter Nonimmune (01/06 0000)   3)Bottle / Depo Provera  4) Disposition PPD2

## 2015-05-16 MED ORDER — PRENATAL MULTIVITAMIN CH: 1.0000 | ORAL_TABLET | Freq: Every day | ORAL | Status: DC

## 2015-05-16 MED ORDER — MEDROXYPROGESTERONE ACETATE 150 MG/ML IM SUSP
150.0000 mg | INTRAMUSCULAR | Status: DC
  Administered 2015-05-16: 150 mg via INTRAMUSCULAR
  Filled 2015-05-16: qty 1

## 2015-05-16 MED ORDER — IBUPROFEN 600 MG PO TABS: 600.0000 mg | ORAL_TABLET | Freq: Four times a day (QID) | ORAL | Status: DC

## 2015-05-16 NOTE — Progress Notes (Signed)
Patient understands all discharge instructions and the need to make follow up appointments. Patient discharge via wheelchair with RN. 

## 2015-05-17 LAB — CORD BLOOD GAS (ARTERIAL)
ACID-BASE DEFICIT: 15.7 mmol/L — AB (ref 0.0–2.0)
BICARBONATE: 16.6 meq/L — AB (ref 21.0–28.0)
PCO2 CORD BLOOD: 69 mmHg — AB (ref 42.0–56.0)
PH CORD BLOOD: 6.99 — AB (ref 7.210–7.380)

## 2015-05-18 LAB — SURGICAL PATHOLOGY

## 2015-06-03 IMAGING — US ABDOMEN ULTRASOUND LIMITED
1 series · 14 of 25 positions shown · non-contrast
Comparison: 

REASON FOR EXAM: RUQ pain eval
COMMENTS:   Body Site: GB and Fossa, CBD, Head of Pancreas

PROCEDURE:     US  - US ABDOMEN LIMITED SURVEY  - June 04, 2013  [DATE]
RESULT:     Right or quadrant abdominal ultrasound dated
TECHNIQUE: Real time sonographic imaging of the right quadrant was obtained.
Static representative images were provided for interpretation.

[Series 1: abdomen ultrasound limited · 0.26mm/px · 14 of 47 slices shown]
[im 1/47]
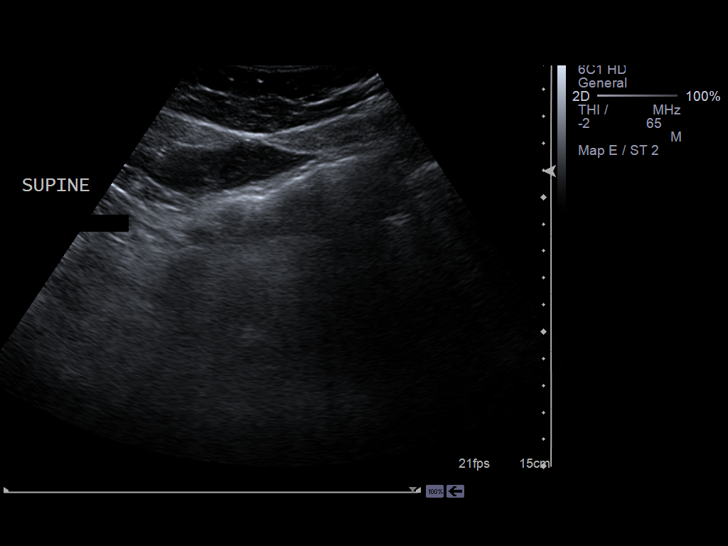
[im 4/47]
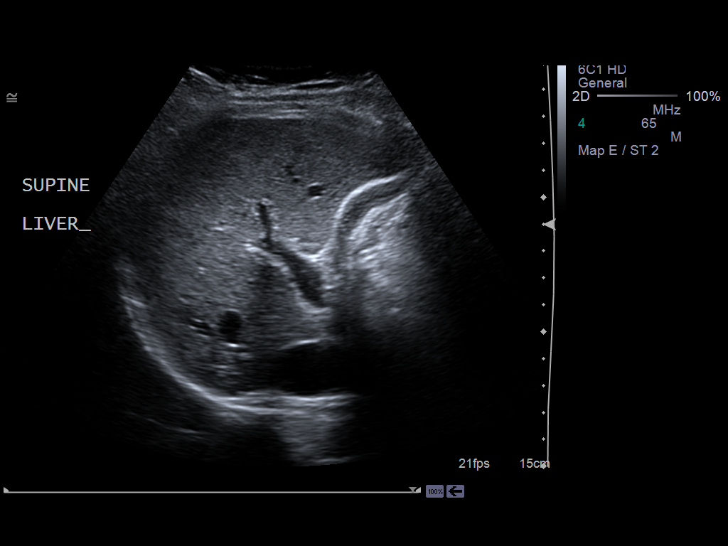
[im 8/47]
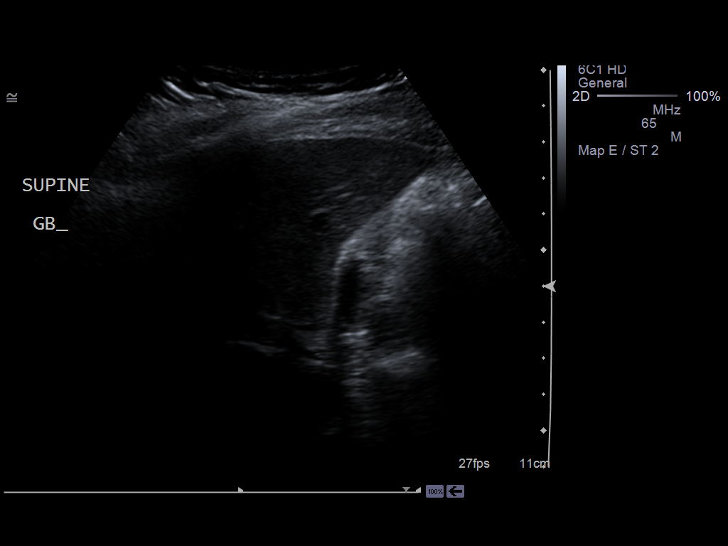
[im 12/47]
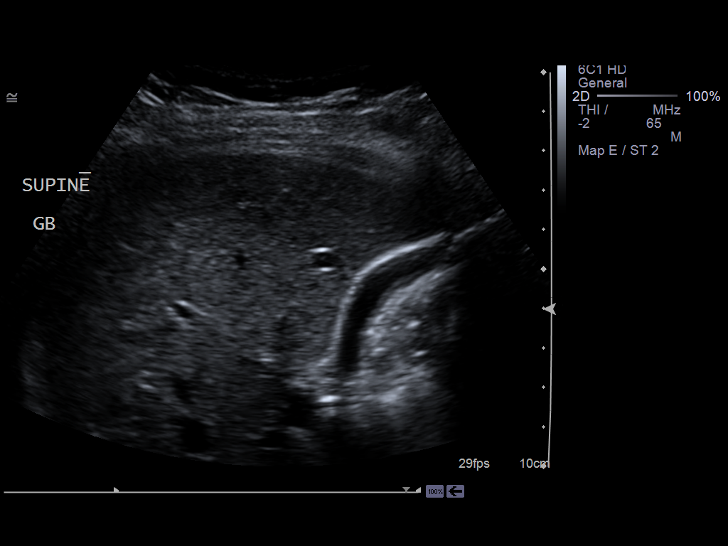
[im 16/47]
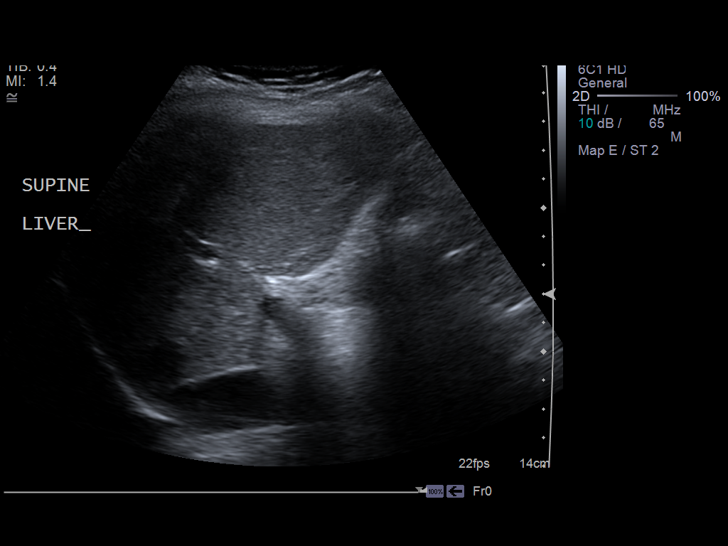
[im 18/47]
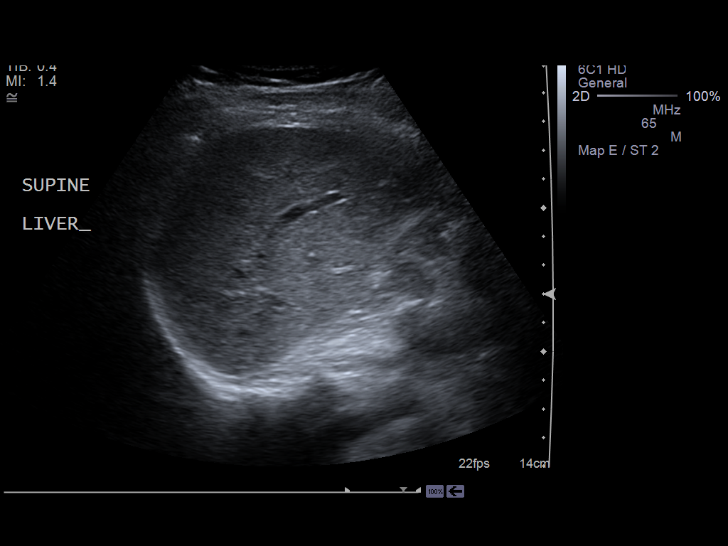
[im 22/47]
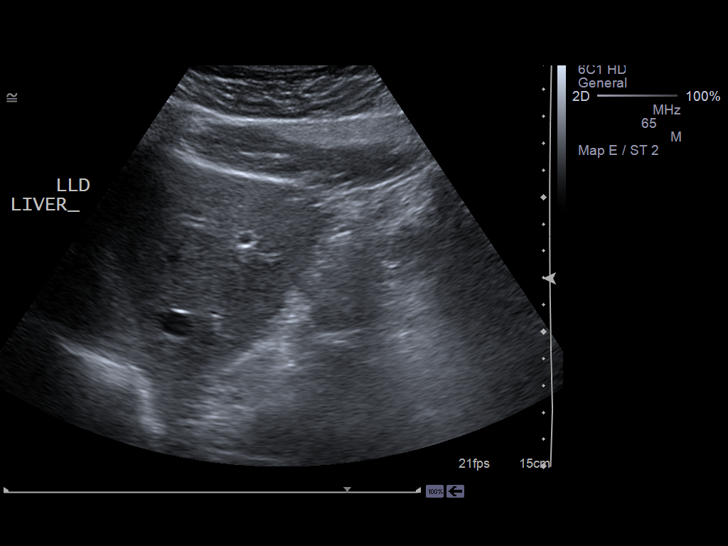
[im 25/47]
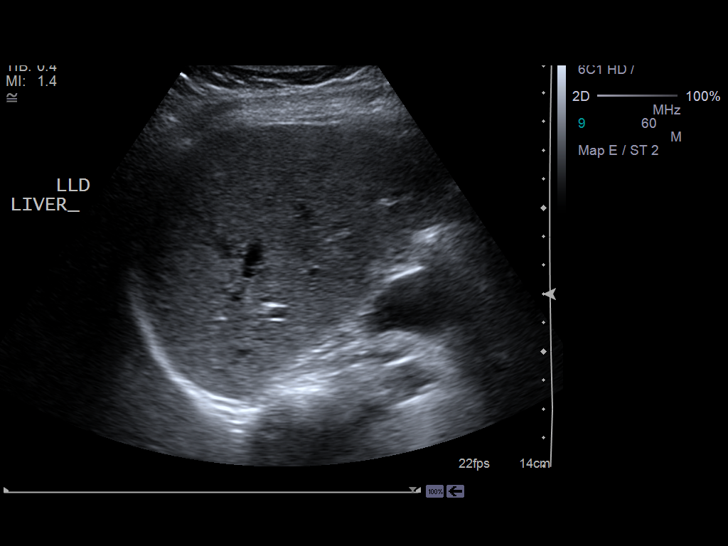
[im 29/47]
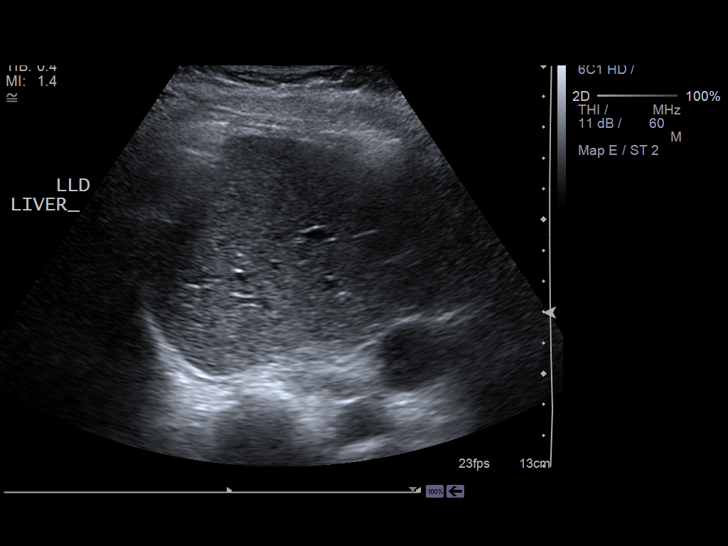
[im 31/47]
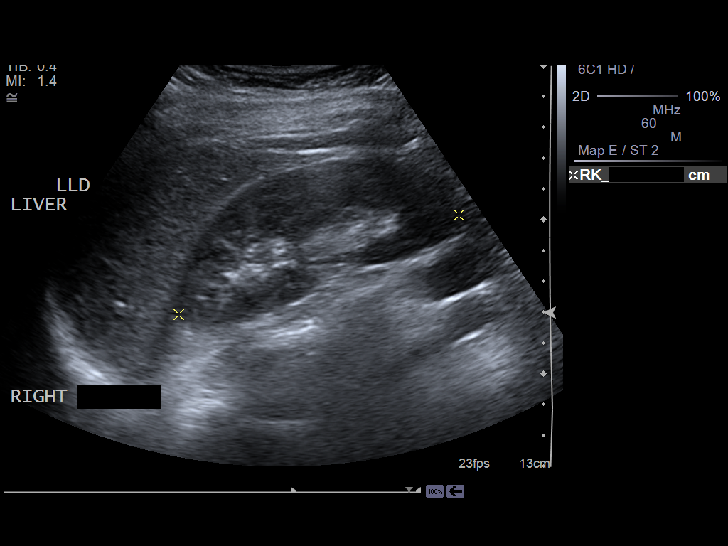
[im 35/47]
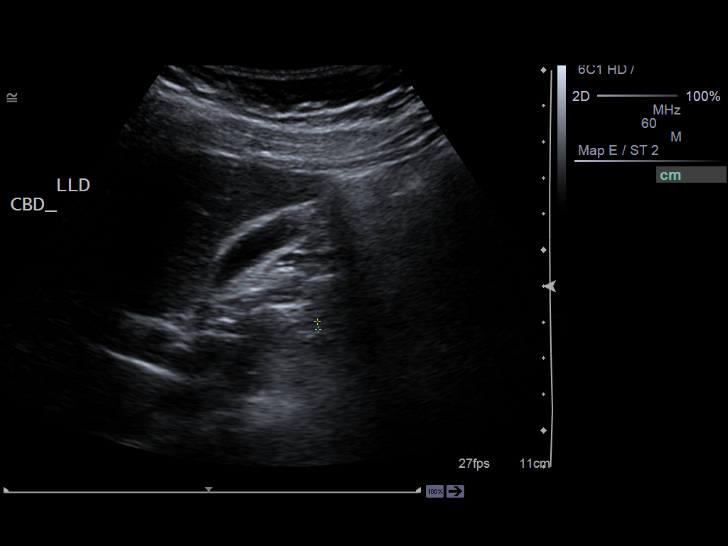
[im 39/47]
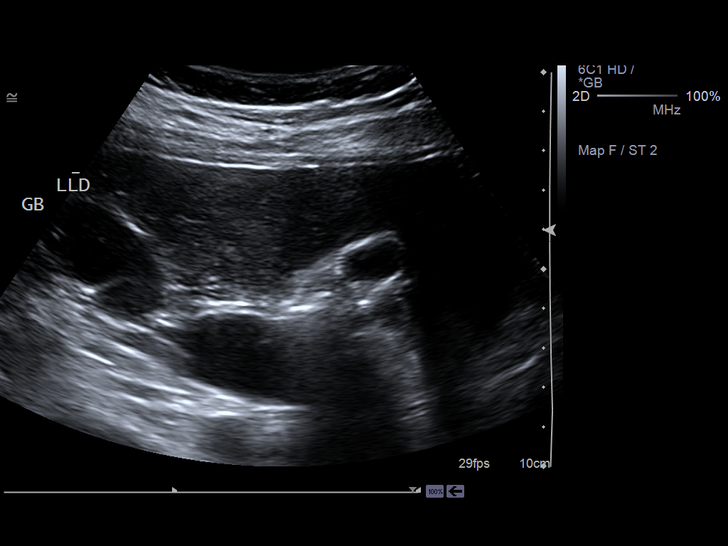
[im 43/47]
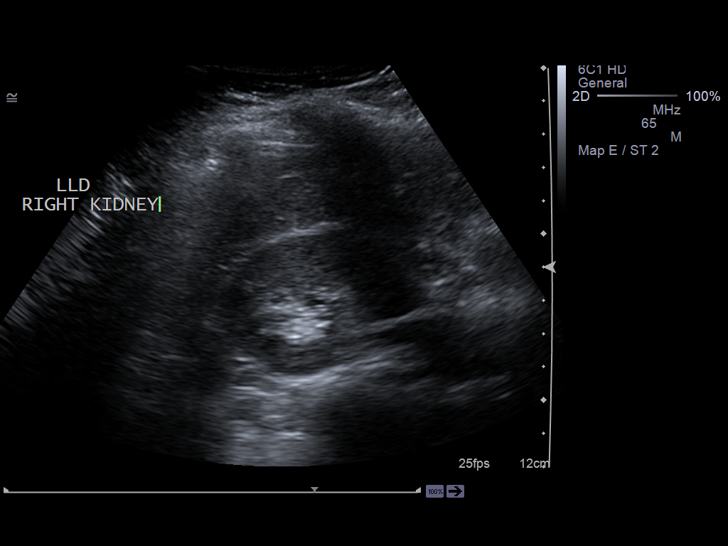
[im 47/47]
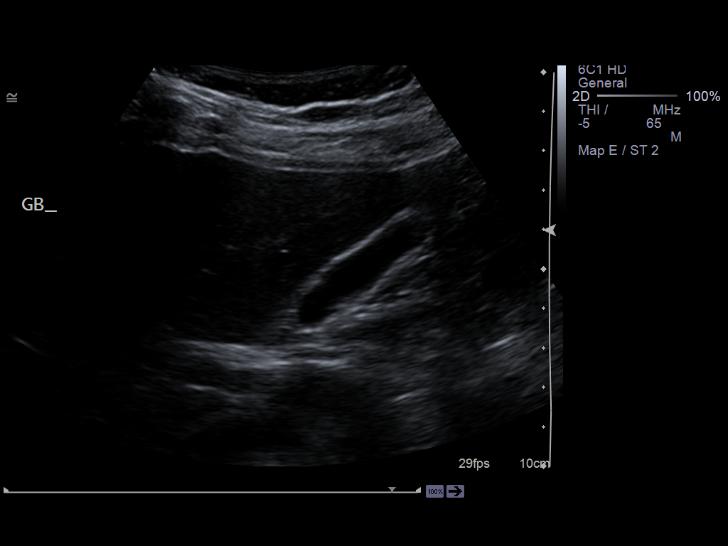

[14 of 25 positions shown; findings below may reference images not displayed]

FINDINGS: The liver is unremarkable. Hepatopedal flow is identified within
the portal vein.

There is no evidence of pericholecystic fluid, gallstones, sludging,
gallbladder wall thickening, nor common bile duct dilation. The gallbladder
wall thickness is 1.4 mm and the common bile duct measures 2.6 mm in
diameter. The technologist was unable to elicit a sonographic Murphy's sign.
The pancreas is not well visualized.
IMPRESSION: No sonographic evidence of cholecystitis nor cholelithiasis.

## 2015-12-13 ENCOUNTER — Emergency Department
Admission: EM | Admit: 2015-12-13 | Discharge: 2015-12-13 | Disposition: A | Discharge status: Home / Self Care | Payer: Medicaid Other | Attending: Emergency Medicine | Admitting: Emergency Medicine

## 2015-12-13 ENCOUNTER — Encounter: Payer: Self-pay | Admitting: Emergency Medicine

## 2015-12-13 DIAGNOSIS — Z79899 Other long term (current) drug therapy: Secondary | ICD-10-CM | POA: Diagnosis not present

## 2015-12-13 DIAGNOSIS — J069 Acute upper respiratory infection, unspecified: Secondary | ICD-10-CM | POA: Insufficient documentation

## 2015-12-13 DIAGNOSIS — H7493 Unspecified disorder of middle ear and mastoid, bilateral: Secondary | ICD-10-CM | POA: Insufficient documentation

## 2015-12-13 DIAGNOSIS — R0981 Nasal congestion: Secondary | ICD-10-CM | POA: Diagnosis present

## 2015-12-13 LAB — POCT RAPID STREP A: Streptococcus, Group A Screen (Direct): NEGATIVE

## 2015-12-13 MED ORDER — MAGIC MOUTHWASH W/LIDOCAINE: 5.0000 mL | Freq: Four times a day (QID) | ORAL | Status: DC | PRN

## 2015-12-13 MED ORDER — PSEUDOEPH-BROMPHEN-DM 30-2-10 MG/5ML PO SYRP: 10.0000 mL | ORAL_SOLUTION | Freq: Four times a day (QID) | ORAL | Status: DC | PRN

## 2015-12-13 MED ORDER — FLUTICASONE PROPIONATE 50 MCG/ACT NA SUSP: 2.0000 | Freq: Every day | NASAL | Status: DC

## 2015-12-13 NOTE — ED Notes (Signed)
Pt with sore throat, congestion, cough x 2 days. Pt alert & oriented. NAD noted.

## 2015-12-13 NOTE — Discharge Instructions (Signed)
Upper Respiratory Infection, Adult Most upper respiratory infections (URIs) are a viral infection of the air passages leading to the lungs. A URI affects the nose, throat, and upper air passages. The most common type of URI is nasopharyngitis and is typically referred to as "the common cold." URIs run their course and usually go away on their own. Most of the time, a URI does not require medical attention, but sometimes a bacterial infection in the upper airways can follow a viral infection. This is called a secondary infection. Sinus and middle ear infections are common types of secondary upper respiratory infections. Bacterial pneumonia can also complicate a URI. A URI can worsen asthma and chronic obstructive pulmonary disease (COPD). Sometimes, these complications can require emergency medical care and may be life threatening.  CAUSES Almost all URIs are caused by viruses. A virus is a type of germ and can spread from one person to another.  RISKS FACTORS You may be at risk for a URI if:   You smoke.   You have chronic heart or lung disease.  You have a weakened defense (immune) system.   You are very young or very old.   You have nasal allergies or asthma.  You work in crowded or poorly ventilated areas.  You work in health care facilities or schools. SIGNS AND SYMPTOMS  Symptoms typically develop 2-3 days after you come in contact with a cold virus. Most viral URIs last 7-10 days. However, viral URIs from the influenza virus (flu virus) can last 14-18 days and are typically more severe. Symptoms may include:   Runny or stuffy (congested) nose.   Sneezing.   Cough.   Sore throat.   Headache.   Fatigue.   Fever.   Loss of appetite.   Pain in your forehead, behind your eyes, and over your cheekbones (sinus pain).  Muscle aches.  DIAGNOSIS  Your health care provider may diagnose a URI by:  Physical exam.  Tests to check that your symptoms are not due to  another condition such as:  Strep throat.  Sinusitis.  Pneumonia.  Asthma. TREATMENT  A URI goes away on its own with time. It cannot be cured with medicines, but medicines may be prescribed or recommended to relieve symptoms. Medicines may help:  Reduce your fever.  Reduce your cough.  Relieve nasal congestion. HOME CARE INSTRUCTIONS   Take medicines only as directed by your health care provider.   Gargle warm saltwater or take cough drops to comfort your throat as directed by your health care provider.  Use a warm mist humidifier or inhale steam from a shower to increase air moisture. This may make it easier to breathe.  Drink enough fluid to keep your urine clear or pale yellow.   Eat soups and other clear broths and maintain good nutrition.   Rest as needed.   Return to work when your temperature has returned to normal or as your health care provider advises. You may need to stay home longer to avoid infecting others. You can also use a face mask and careful hand washing to prevent spread of the virus.  Increase the usage of your inhaler if you have asthma.   Do not use any tobacco products, including cigarettes, chewing tobacco, or electronic cigarettes. If you need help quitting, ask your health care provider. PREVENTION  The best way to protect yourself from getting a cold is to practice good hygiene.   Avoid oral or hand contact with people with cold   symptoms.   Wash your hands often if contact occurs.  There is no clear evidence that vitamin C, vitamin E, echinacea, or exercise reduces the chance of developing a cold. However, it is always recommended to get plenty of rest, exercise, and practice good nutrition.  SEEK MEDICAL CARE IF:   You are getting worse rather than better.   Your symptoms are not controlled by medicine.   You have chills.  You have worsening shortness of breath.  You have brown or red mucus.  You have yellow or brown nasal  discharge.  You have pain in your face, especially when you bend forward.  You have a fever.  You have swollen neck glands.  You have pain while swallowing.  You have white areas in the back of your throat. SEEK IMMEDIATE MEDICAL CARE IF:   You have severe or persistent:  Headache.  Ear pain.  Sinus pain.  Chest pain.  You have chronic lung disease and any of the following:  Wheezing.  Prolonged cough.  Coughing up blood.  A change in your usual mucus.  You have a stiff neck.  You have changes in your:  Vision.  Hearing.  Thinking.  Mood. MAKE SURE YOU:   Understand these instructions.  Will watch your condition.  Will get help right away if you are not doing well or get worse.   This information is not intended to replace advice given to you by your health care provider. Make sure you discuss any questions you have with your health care provider.   Document Released: 03/07/2001 Document Revised: 01/26/2015 Document Reviewed: 12/17/2013 Elsevier Interactive Patient Education 2016 [employer]  

## 2015-12-13 NOTE — ED Notes (Signed)
Pt presents with head congestion, fever and cough, sore throat started yesterday.

## 2015-12-13 NOTE — ED Provider Notes (Signed)
[facility] Emergency Department Provider Note  ____________________________________________  Time seen: Approximately 3:39 PM  I have reviewed the triage vital signs and the nursing notes.   HISTORY  Chief Complaint Sore Throat and Nasal Congestion    HPI RATASHA FABRE is a 20 y.o. female , NAD, presents to the emergency department with sudden onset sore throat, headache, cough, chest congestion since yesterday. Has had subjective fevers has been taking over-the-counter DayQuil and NyQuil. States she has not improved much since beginning those medications. He chest or back pain. Has not had any neck soreness or stiffness. No abdominal pain, nausea, vomiting, diarrhea. No known sick exposures.   Past Medical History  Diagnosis Date  . Renal disorder   . Kidney stone     Patient Active Problem List   Diagnosis Date Noted  . Labor and delivery, indication for care 04/16/2015    Past Surgical History  Procedure Laterality Date  . No past surgeries      Current Outpatient Rx  Name  Route  Sig  Dispense  Refill  . brompheniramine-pseudoephedrine-DM 30-2-10 MG/5ML syrup   Oral   Take 10 mLs by mouth 4 (four) times daily as needed.   200 mL   0   . fluticasone (FLONASE) 50 MCG/ACT nasal spray   Each Nare   Place 2 sprays into both nostrils daily.   16 g   0   . ibuprofen (ADVIL,MOTRIN) 600 MG tablet   Oral   Take 1 tablet (600 mg total) by mouth every 6 (six) hours.   30 tablet   0   . magic mouthwash w/lidocaine SOLN   Oral   Take 5 mLs by mouth 4 (four) times daily as needed for mouth pain.   240 mL   0     Please mix 80mL diphenhydramine, 80mL nystatin, 80 ...   . Prenatal Vit-Fe Fumarate-FA (PRENATAL MULTIVITAMIN) TABS tablet   Oral   Take 1 tablet by mouth daily at 12 noon.   30 tablet   11     Allergies Review of patient's allergies indicates no known allergies.  No family history on file.  Social History Social  History  Substance Use Topics  . Smoking status: Never Smoker   . Smokeless tobacco: Never Used  . Alcohol Use: No     Review of Systems  Constitutional: Positive fever, chills. No sweats Eyes: No visual changes. No discharge, erythema, swelling ENT: Positive nasal congestion, runny nose, sore throat. No ear pain Cardiovascular: No chest pain. Respiratory: Positive chest congestion, cough. No shortness of breath. No wheezing.  Gastrointestinal: No abdominal pain.  No nausea, vomiting.  No diarrhea.  Musculoskeletal: Negative for back and neck pain.  Skin: Negative for rash. Neurological: Positive for headaches, but no focal weakness or numbness. 10-point ROS otherwise negative.  ____________________________________________   PHYSICAL EXAM:  VITAL SIGNS: ED Triage Vitals  Enc Vitals Group     BP 12/13/15 1402 129/80 mmHg     Pulse Rate 12/13/15 1402 118     Resp 12/13/15 1402 20     Temp 12/13/15 1402 98.4 F (36.9 C)     Temp Source 12/13/15 1402 Oral     SpO2 12/13/15 1402 99 %     Weight 12/13/15 1402 175 lb (79.379 kg)     Height 12/13/15 1402  (1.575 m)     Head Cir --      Peak Flow --      Pain Score  12/13/15 1418 10     Pain Loc --      Pain Edu? --      Excl. in GC? --     Constitutional: Alert and oriented. Well appearing and in no acute distress. Eyes: Conjunctivae are normal. PERRL. EOMI without pain.  Head: Atraumatic. ENT:      Ears: TMs visualized bilaterally with mild effusion but no erythema, bulging, perforation.      Nose: Moderate congestion with clear rhinnorhea.      Mouth/Throat: Mucous membranes are moist. There is with mild injection but no swelling, exudate. Clear postnasal drip. Neck: No stridor. Supple with full range of motion. Hematological/Lymphatic/Immunilogical: No cervical lymphadenopathy. Cardiovascular: Normal rate, regular rhythm. Normal S1 and S2.  Good peripheral circulation. Respiratory: Normal respiratory effort  without tachypnea or retractions. Lungs CTAB. Neurologic:  Normal speech and language. No gross focal neurologic deficits are appreciated.  Skin:  Skin is warm, dry and intact. No rash noted. Psychiatric: Mood and affect are normal. Speech and behavior are normal. Patient exhibits appropriate insight and judgement.   ____________________________________________   LABS (all labs ordered are listed, but only abnormal results are displayed)  Labs Reviewed - No data to display ____________________________________________  EKG  None ____________________________________________  RADIOLOGY  None ____________________________________________    PROCEDURES  Procedure(s) performed: None    Medications - No data to display   ____________________________________________   INITIAL IMPRESSION / ASSESSMENT AND PLAN / ED COURSE  Pertinent labs & imaging results that were available during my care of the patient were reviewed by me and considered in my medical decision making (see chart for details).  Rapid strep test was negative  Patient's diagnosis is consistent with viral upper respiratory infection. Patient will be discharged home with prescriptions for Bromfed-DM, Flonase, magic mouthwash to use as directed. May continue over-the-counter Tylenol or ibuprofen as needed for fever and aches. [facility] if symptoms persist past this treatment course. Patient is given ED precautions to return to the ED for any worsening or new symptoms.    ____________________________________________  FINAL CLINICAL IMPRESSION(S) / ED DIAGNOSES  Final diagnoses:  Viral upper respiratory infection      NEW MEDICATIONS STARTED DURING THIS VISIT:  New Prescriptions   BROMPHENIRAMINE-PSEUDOEPHEDRINE-DM 30-2-10 MG/5ML SYRUP    Take 10 mLs by mouth 4 (four) times daily as needed.   FLUTICASONE (FLONASE) 50 MCG/ACT NASAL SPRAY    Place 2 sprays into  both nostrils daily.   MAGIC MOUTHWASH W/LIDOCAINE SOLN    Take 5 mLs by mouth 4 (four) times daily as needed for mouth pain.         Hope PigeonJami L Vernon Ariel, PA-C 12/13/15 1609  Governor Rooksebecca Lord, MD 12/16/15 1425

## 2015-12-14 ENCOUNTER — Encounter (HOSPITAL_COMMUNITY): Payer: Self-pay

## 2015-12-14 ENCOUNTER — Emergency Department (HOSPITAL_COMMUNITY)
Admission: EM | Admit: 2015-12-14 | Discharge: 2015-12-14 | Disposition: A | Discharge status: Home / Self Care | Payer: Medicaid Other | Attending: Emergency Medicine | Admitting: Emergency Medicine

## 2015-12-14 DIAGNOSIS — B349 Viral infection, unspecified: Secondary | ICD-10-CM | POA: Diagnosis not present

## 2015-12-14 DIAGNOSIS — J029 Acute pharyngitis, unspecified: Secondary | ICD-10-CM | POA: Diagnosis present

## 2015-12-14 DIAGNOSIS — Z79899 Other long term (current) drug therapy: Secondary | ICD-10-CM | POA: Insufficient documentation

## 2015-12-14 MED ORDER — ALBUTEROL SULFATE HFA 108 (90 BASE) MCG/ACT IN AERS: 1.0000 | INHALATION_SPRAY | Freq: Four times a day (QID) | RESPIRATORY_TRACT | Status: DC | PRN

## 2015-12-14 NOTE — ED Provider Notes (Signed)
CSN: [id number]     Arrival date & time 12/14/15  1343 History   First MD Initiated Contact with Patient 12/14/15 1714     Chief Complaint  Patient presents with  . Sore Throat  . Diarrhea     (Consider location/radiation/quality/duration/timing/severity/associated sxs/prior Treatment) HPI.....cough, congestion, diarrhea, low grade fever, hoarseness for 2 days.  Husband and daughter have similar symptoms. She is able to keep some fluids down. No stiff neck. Seen yesterday in [place] and diagnosed with a viral syndrome  Past Medical History  Diagnosis Date  . Renal disorder   . Kidney stone    Past Surgical History  Procedure Laterality Date  . No past surgeries     No family history on file. Social History  Substance Use Topics  . Smoking status: Never Smoker   . Smokeless tobacco: Never Used  . Alcohol Use: No   OB History    Gravida Para Term Preterm AB TAB SAB Ectopic Multiple Living   1 1 1       0 1     Review of Systems  All other systems reviewed and are negative.     Allergies  Review of patient's allergies indicates no known allergies.  Home Medications   Prior to Admission medications   Medication Sig Start Date End Date Taking? Authorizing Provider  brompheniramine-pseudoephedrine-DM 30-2-10 MG/5ML syrup Take 10 mLs by mouth 4 (four) times daily as needed. 12/13/15  Yes Jami L Hagler, PA-C  fluticasone (FLONASE) 50 MCG/ACT nasal spray Place 2 sprays into both nostrils daily. 12/13/15  Yes Jami L Hagler, PA-C  magic mouthwash w/lidocaine SOLN Take 5 mLs by mouth 4 (four) times daily as needed for mouth pain. 12/13/15  Yes Jami L Hagler, PA-C  albuterol (PROVENTIL HFA;VENTOLIN HFA) 108 (90 Base) MCG/ACT inhaler Inhale 1-2 puffs into the lungs every 6 (six) hours as needed for wheezing or shortness of breath. 12/14/15   Donnetta HutchingBrian Brunette Lavalle, MD   BP 127/72 mmHg  Pulse 119  Temp(Src) 100.2 F (37.9 C) (Oral)  Resp 16  Ht 5\' 2"  (1.575 m)  Wt 175 lb (79.379 kg)   BMI 32.00 kg/m2  SpO2 100%  LMP 12/14/2015  Breastfeeding? No Physical Exam  Constitutional: She is oriented to person, place, and time. She appears well-developed and well-nourished.  Nontoxic-appearing  HENT:  Head: Normocephalic and atraumatic.  Eyes: Conjunctivae and EOM are normal. Pupils are equal, round, and reactive to light.  Neck: Normal range of motion. Neck supple.  Cardiovascular: Normal rate and regular rhythm.   Pulmonary/Chest: Effort normal and breath sounds normal.  Abdominal: Soft. Bowel sounds are normal.  Musculoskeletal: Normal range of motion.  Neurological: She is alert and oriented to person, place, and time.  Skin: Skin is warm and dry.  Psychiatric: She has a normal mood and affect. Her behavior is normal.  Nursing note and vitals reviewed.   ED Course  Procedures (including critical care time) Labs Review Labs Reviewed - No data to display  Imaging Review No results found. I have personally reviewed and evaluated these images and lab results as part of my medical decision-making.   EKG Interpretation None      MDM   Final diagnoses:  Viral syndrome    Pt does not appear toxic. No stiff neck. Suspect viral syndrome in the entire family.    Donnetta HutchingBrian Yarielis Funaro, MD 12/14/15 2022

## 2015-12-14 NOTE — Discharge Instructions (Signed)
Gargle with salt water. Prescription for an inhaler for wheezing and coughing. Continue your other medications. Increase fluids. Tylenol for fever.

## 2015-12-14 NOTE — ED Notes (Addendum)
Pt reports sore throat and hoarseness for past few days.  Went to [facility] yesterady and was given some cough medicine and magic mouthwash.  Reports today has fever and diarrhea x 3.  No n/v.

## 2016-07-21 LAB — HM PAP SMEAR: HM PAP: NORMAL

## 2017-01-15 ENCOUNTER — Ambulatory Visit (INDEPENDENT_AMBULATORY_CARE_PROVIDER_SITE_OTHER): Payer: Medicaid Other

## 2017-01-15 DIAGNOSIS — Z3042 Encounter for surveillance of injectable contraceptive: Secondary | ICD-10-CM | POA: Diagnosis not present

## 2017-01-15 DIAGNOSIS — Z308 Encounter for other contraceptive management: Secondary | ICD-10-CM

## 2017-01-15 MED ORDER — MEDROXYPROGESTERONE ACETATE 150 MG/ML IM SUSP
150.0000 mg | Freq: Once | INTRAMUSCULAR | Status: AC
  Administered 2017-01-15: 150 mg via INTRAMUSCULAR

## 2017-01-15 NOTE — Progress Notes (Signed)
Pt here for depo given IM left deltoid.  NDC# [id number]

## 2017-04-09 ENCOUNTER — Ambulatory Visit: Payer: Self-pay

## 2017-04-10 ENCOUNTER — Ambulatory Visit (INDEPENDENT_AMBULATORY_CARE_PROVIDER_SITE_OTHER): Payer: Medicaid Other

## 2017-04-10 DIAGNOSIS — Z309 Encounter for contraceptive management, unspecified: Secondary | ICD-10-CM | POA: Diagnosis not present

## 2017-04-10 DIAGNOSIS — Z3042 Encounter for surveillance of injectable contraceptive: Secondary | ICD-10-CM

## 2017-04-10 MED ORDER — MEDROXYPROGESTERONE ACETATE 150 MG/ML IM SUSP
150.0000 mg | Freq: Once | INTRAMUSCULAR | Status: AC
  Administered 2017-04-10: 150 mg via INTRAMUSCULAR

## 2017-07-03 ENCOUNTER — Ambulatory Visit (INDEPENDENT_AMBULATORY_CARE_PROVIDER_SITE_OTHER): Payer: Medicaid Other

## 2017-07-03 DIAGNOSIS — Z308 Encounter for other contraceptive management: Secondary | ICD-10-CM

## 2017-07-03 DIAGNOSIS — Z3042 Encounter for surveillance of injectable contraceptive: Secondary | ICD-10-CM

## 2017-07-03 MED ORDER — MEDROXYPROGESTERONE ACETATE 150 MG/ML IM SUSP
150.0000 mg | Freq: Once | INTRAMUSCULAR | Status: AC
  Administered 2017-07-03: 150 mg via INTRAMUSCULAR

## 2017-07-03 NOTE — Progress Notes (Signed)
Pt here for depo inj which was given IM right glut.  NDC# [address]-2 

## 2017-09-23 ENCOUNTER — Other Ambulatory Visit: Payer: Self-pay | Admitting: Advanced Practice Midwife

## 2017-09-26 ENCOUNTER — Ambulatory Visit (INDEPENDENT_AMBULATORY_CARE_PROVIDER_SITE_OTHER): Payer: Medicaid Other

## 2017-09-26 ENCOUNTER — Ambulatory Visit: Payer: Medicaid Other

## 2017-09-26 DIAGNOSIS — Z3042 Encounter for surveillance of injectable contraceptive: Secondary | ICD-10-CM

## 2017-09-26 DIAGNOSIS — Z309 Encounter for contraceptive management, unspecified: Secondary | ICD-10-CM

## 2017-09-26 MED ORDER — MEDROXYPROGESTERONE ACETATE 150 MG/ML IM SUSP
150.0000 mg | Freq: Once | INTRAMUSCULAR | Status: AC
  Administered 2017-09-26: 150 mg via INTRAMUSCULAR

## 2017-12-18 ENCOUNTER — Other Ambulatory Visit: Payer: Self-pay | Admitting: Advanced Practice Midwife

## 2017-12-18 ENCOUNTER — Other Ambulatory Visit: Payer: Self-pay | Admitting: Obstetrics and Gynecology

## 2017-12-18 DIAGNOSIS — Z3042 Encounter for surveillance of injectable contraceptive: Secondary | ICD-10-CM

## 2017-12-18 MED ORDER — MEDROXYPROGESTERONE ACETATE 150 MG/ML IM SUSY
1.0000 mL | PREFILLED_SYRINGE | INTRAMUSCULAR | 4 refills | Status: DC
Start: 2017-12-18 — End: 2017-12-19

## 2017-12-18 NOTE — Telephone Encounter (Signed)
Spoke w/pt. Notified she is past due for AE. Pt transferred to front for scheduling. Pt was previously scheduled for depo injection tomorrow 12/19/17.

## 2017-12-18 NOTE — Telephone Encounter (Signed)
Pt reports she tried to refill her Depo provera at her pharmacy & was told she didn't have any refills. UJ#[id number]#[phone]

## 2017-12-18 NOTE — Telephone Encounter (Signed)
Pt has not had annual since 2017. Please advise

## 2017-12-19 ENCOUNTER — Ambulatory Visit (INDEPENDENT_AMBULATORY_CARE_PROVIDER_SITE_OTHER): Payer: Medicaid Other

## 2017-12-19 ENCOUNTER — Other Ambulatory Visit: Payer: Self-pay

## 2017-12-19 DIAGNOSIS — Z308 Encounter for other contraceptive management: Secondary | ICD-10-CM

## 2017-12-19 DIAGNOSIS — Z3042 Encounter for surveillance of injectable contraceptive: Secondary | ICD-10-CM

## 2017-12-19 MED ORDER — MEDROXYPROGESTERONE ACETATE 150 MG/ML IM SUSY: 1.0000 mL | PREFILLED_SYRINGE | INTRAMUSCULAR | 0 refills | Status: DC

## 2017-12-19 MED ORDER — MEDROXYPROGESTERONE ACETATE 150 MG/ML IM SUSP
150.0000 mg | Freq: Once | INTRAMUSCULAR | Status: AC
  Administered 2017-12-19: 150 mg via INTRAMUSCULAR

## 2018-01-03 ENCOUNTER — Encounter: Payer: Self-pay | Admitting: Advanced Practice Midwife

## 2018-01-03 ENCOUNTER — Ambulatory Visit (INDEPENDENT_AMBULATORY_CARE_PROVIDER_SITE_OTHER): Payer: Medicaid Other | Admitting: Advanced Practice Midwife

## 2018-01-03 VITALS — BP 122/74 | Ht 62.0 in | Wt 167.0 lb

## 2018-01-03 DIAGNOSIS — Z3042 Encounter for surveillance of injectable contraceptive: Secondary | ICD-10-CM

## 2018-01-03 DIAGNOSIS — Z01419 Encounter for gynecological examination (general) (routine) without abnormal findings: Secondary | ICD-10-CM

## 2018-01-03 DIAGNOSIS — Z0000 Encounter for general adult medical examination without abnormal findings: Secondary | ICD-10-CM | POA: Diagnosis not present

## 2018-01-03 DIAGNOSIS — Z124 Encounter for screening for malignant neoplasm of cervix: Secondary | ICD-10-CM

## 2018-01-03 MED ORDER — MEDROXYPROGESTERONE ACETATE 150 MG/ML IM SUSY: 1.0000 mL | PREFILLED_SYRINGE | INTRAMUSCULAR | 3 refills | Status: DC

## 2018-01-03 NOTE — Patient Instructions (Signed)
Health Maintenance, Female Adopting a healthy lifestyle and getting preventive care can go a long way to promote health and wellness. Talk with your health care provider about what schedule of regular examinations is right for you. This is a good chance for you to check in with your provider about disease prevention and staying healthy. In between checkups, there are plenty of things you can do on your own. Experts have done a lot of research about which lifestyle changes and preventive measures are most likely to keep you healthy. Ask your health care provider for more information. Weight and diet Eat a healthy diet  Be sure to include plenty of vegetables, fruits, low-fat dairy products, and lean protein.  Do not eat a lot of foods high in solid fats, added sugars, or salt.  Get regular exercise. This is one of the most important things you can do for your health. ? Most adults should exercise for at least 150 minutes each week. The exercise should increase your heart rate and make you sweat (moderate-intensity exercise). ? Most adults should also do strengthening exercises at least twice a week. This is in addition to the moderate-intensity exercise.  Maintain a healthy weight  Body mass index (BMI) is a measurement that can be used to identify possible weight problems. It estimates body fat based on height and weight. Your health care provider can help determine your BMI and help you achieve or maintain a healthy weight.  For females 69 years of age and older: ? A BMI below 18.5 is considered underweight. ? A BMI of 18.5 to 24.9 is normal. ? A BMI of 25 to 29.9 is considered overweight. ? A BMI of 30 and above is considered obese.  Watch levels of cholesterol and blood lipids  You should start having your blood tested for lipids and cholesterol at 23 years of age, then have this test every 5 years.  You may need to have your cholesterol levels checked more often if: ? Your lipid or  cholesterol levels are high. ? You are older than 23 years of age. ? You are at high risk for heart disease.  Cancer screening Lung Cancer  Lung cancer screening is recommended for adults 70-27 years old who are at high risk for lung cancer because of a history of smoking.  A yearly low-dose CT scan of the lungs is recommended for people who: ? Currently smoke. ? Have quit within the past 15 years. ? Have at least a 30-pack-year history of smoking. A pack year is smoking an average of one pack of cigarettes a day for 1 year.  Yearly screening should continue until it has been 15 years since you quit.  Yearly screening should stop if you develop a health problem that would prevent you from having lung cancer treatment.  Breast Cancer  Practice breast self-awareness. This means understanding how your breasts normally appear and feel.  It also means doing regular breast self-exams. Let your health care provider know about any changes, no matter how small.  If you are in your 20s or 30s, you should have a clinical breast exam (CBE) by a health care provider every 1-3 years as part of a regular health exam.  If you are 68 or older, have a CBE every year. Also consider having a breast X-ray (mammogram) every year.  If you have a family history of breast cancer, talk to your health care provider about genetic screening.  If you are at high risk  for breast cancer, talk to your health care provider about having an MRI and a mammogram every year.  Breast cancer gene (BRCA) assessment is recommended for women who have family members with BRCA-related cancers. BRCA-related cancers include: ? Breast. ? Ovarian. ? Tubal. ? Peritoneal cancers.  Results of the assessment will determine the need for genetic counseling and BRCA1 and BRCA2 testing.  Cervical Cancer Your health care provider may recommend that you be screened regularly for cancer of the pelvic organs (ovaries, uterus, and  vagina). This screening involves a pelvic examination, including checking for microscopic changes to the surface of your cervix (Pap test). You may be encouraged to have this screening done every 3 years, beginning at age 22.  For women ages 56-65, health care providers may recommend pelvic exams and Pap testing every 3 years, or they may recommend the Pap and pelvic exam, combined with testing for human papilloma virus (HPV), every 5 years. Some types of HPV increase your risk of cervical cancer. Testing for HPV may also be done on women of any age with unclear Pap test results.  Other health care providers may not recommend any screening for nonpregnant women who are considered low risk for pelvic cancer and who do not have symptoms. Ask your health care provider if a screening pelvic exam is right for you.  If you have had past treatment for cervical cancer or a condition that could lead to cancer, you need Pap tests and screening for cancer for at least 20 years after your treatment. If Pap tests have been discontinued, your risk factors (such as having a new sexual partner) need to be reassessed to determine if screening should resume. Some women have medical problems that increase the chance of getting cervical cancer. In these cases, your health care provider may recommend more frequent screening and Pap tests.  Colorectal Cancer  This type of cancer can be detected and often prevented.  Routine colorectal cancer screening usually begins at 23 years of age and continues through 23 years of age.  Your health care provider may recommend screening at an earlier age if you have risk factors for colon cancer.  Your health care provider may also recommend using home test kits to check for hidden blood in the stool.  A small camera at the end of a tube can be used to examine your colon directly (sigmoidoscopy or colonoscopy). This is done to check for the earliest forms of colorectal  cancer.  Routine screening usually begins at age 33.  Direct examination of the colon should be repeated every 5-10 years through 23 years of age. However, you may need to be screened more often if early forms of precancerous polyps or small growths are found.  Skin Cancer  Check your skin from head to toe regularly.  Tell your health care provider about any new moles or changes in moles, especially if there is a change in a mole's shape or color.  Also tell your health care provider if you have a mole that is larger than the size of a pencil eraser.  Always use sunscreen. Apply sunscreen liberally and repeatedly throughout the day.  Protect yourself by wearing long sleeves, pants, a wide-brimmed hat, and sunglasses whenever you are outside.  Heart disease, diabetes, and high blood pressure  High blood pressure causes heart disease and increases the risk of stroke. High blood pressure is more likely to develop in: ? People who have blood pressure in the high end of  the normal range (130-139/85-89 mm Hg). ? People who are overweight or obese. ? People who are African American.  If you are 21-29 years of age, have your blood pressure checked every 3-5 years. If you are 3 years of age or older, have your blood pressure checked every year. You should have your blood pressure measured twice-once when you are at a hospital or clinic, and once when you are not at a hospital or clinic. Record the average of the two measurements. To check your blood pressure when you are not at a hospital or clinic, you can use: ? An automated blood pressure machine at a pharmacy. ? A home blood pressure monitor.  If you are between 17 years and 37 years old, ask your health care provider if you should take aspirin to prevent strokes.  Have regular diabetes screenings. This involves taking a blood sample to check your fasting blood sugar level. ? If you are at a normal weight and have a low risk for diabetes,  have this test once every three years after 23 years of age. ? If you are overweight and have a high risk for diabetes, consider being tested at a younger age or more often. Preventing infection Hepatitis B  If you have a higher risk for hepatitis B, you should be screened for this virus. You are considered at high risk for hepatitis B if: ? You were born in a country where hepatitis B is common. Ask your health care provider which countries are considered high risk. ? Your parents were born in a high-risk country, and you have not been immunized against hepatitis B (hepatitis B vaccine). ? You have HIV or AIDS. ? You use needles to inject street drugs. ? You live with someone who has hepatitis B. ? You have had sex with someone who has hepatitis B. ? You get hemodialysis treatment. ? You take certain medicines for conditions, including cancer, organ transplantation, and autoimmune conditions.  Hepatitis C  Blood testing is recommended for: ? Everyone born from [birth year] through 1966. ? Anyone with known risk factors for hepatitis C.  Sexually transmitted infections (STIs)  You should be screened for sexually transmitted infections (STIs) including gonorrhea and chlamydia if: ? You are sexually active and are younger than 23 years of age. ? You are older than 23 years of age and your health care provider tells you that you are at risk for this type of infection. ? Your sexual activity has changed since you were last screened and you are at an increased risk for chlamydia or gonorrhea. Ask your health care provider if you are at risk.  If you do not have HIV, but are at risk, it may be recommended that you take a prescription medicine daily to prevent HIV infection. This is called pre-exposure prophylaxis (PrEP). You are considered at risk if: ? You are sexually active and do not regularly use condoms or know the HIV status of your partner(s). ? You take drugs by injection. ? You are  sexually active with a partner who has HIV.  Talk with your health care provider about whether you are at high risk of being infected with HIV. If you choose to begin PrEP, you should first be tested for HIV. You should then be tested every 3 months for as long as you are taking PrEP. Pregnancy  If you are premenopausal and you may become pregnant, ask your health care provider about preconception counseling.  If you may become  pregnant, take 400 to 800 micrograms (mcg) of folic acid every day.  If you want to prevent pregnancy, talk to your health care provider about birth control (contraception). Osteoporosis and menopause  Osteoporosis is a disease in which the bones lose minerals and strength with aging. This can result in serious bone fractures. Your risk for osteoporosis can be identified using a bone density scan.  If you are 52 years of age or older, or if you are at risk for osteoporosis and fractures, ask your health care provider if you should be screened.  Ask your health care provider whether you should take a calcium or vitamin D supplement to lower your risk for osteoporosis.  Menopause may have certain physical symptoms and risks.  Hormone replacement therapy may reduce some of these symptoms and risks. Talk to your health care provider about whether hormone replacement therapy is right for you. Follow these instructions at home:  Schedule regular health, dental, and eye exams.  Stay current with your immunizations.  Do not use any tobacco products including cigarettes, chewing tobacco, or electronic cigarettes.  If you are pregnant, do not drink alcohol.  If you are breastfeeding, limit how much and how often you drink alcohol.  Limit alcohol intake to no more than 1 drink per day for nonpregnant women. One drink equals 12 ounces of beer, 5 ounces of wine, or 1 ounces of hard liquor.  Do not use street drugs.  Do not share needles.  Ask your health care  provider for help if you need support or information about quitting drugs.  Tell your health care provider if you often feel depressed.  Tell your health care provider if you have ever been abused or do not feel safe at home. This information is not intended to replace advice given to you by your health care provider. Make sure you discuss any questions you have with your health care provider. Document Released: 03/27/2011 Document Revised: 02/17/2016 Document Reviewed: 06/15/2015 Elsevier Interactive Patient Education  2018 [employer].     Why follow it? Research shows. . Those who follow the Mediterranean diet have a reduced risk of heart disease  . The diet is associated with a reduced incidence of Parkinson's and Alzheimer's diseases . People following the diet may have longer life expectancies and lower rates of chronic diseases  . The Dietary Guidelines for Americans recommends the Mediterranean diet as an eating plan to promote health and prevent disease  What Is the Mediterranean Diet?  . Healthy eating plan based on typical foods and recipes of Mediterranean-style cooking . The diet is primarily a plant based diet; these foods should make up a majority of meals   Starches - Plant based foods should make up a majority of meals - They are an important sources of vitamins, minerals, energy, antioxidants, and fiber - Choose whole grains, foods high in fiber and minimally processed items  - Typical grain sources include wheat, oats, barley, corn, brown rice, bulgar, farro, millet, polenta, couscous  - Various types of beans include chickpeas, lentils, fava beans, black beans, white beans   Fruits  Veggies - Large quantities of antioxidant rich fruits & veggies; 6 or more servings  - Vegetables can be eaten raw or lightly drizzled with oil and cooked  - Vegetables common to the traditional Mediterranean Diet include: artichokes, arugula, beets, broccoli, brussel sprouts, cabbage,  carrots, celery, collard greens, cucumbers, eggplant, kale, leeks, lemons, lettuce, mushrooms, okra, onions, peas, peppers, potatoes, pumpkin, radishes, rutabaga,  shallots, spinach, sweet potatoes, turnips, zucchini - Fruits common to the Mediterranean Diet include: apples, apricots, avocados, cherries, clementines, dates, figs, grapefruits, grapes, melons, nectarines, oranges, peaches, pears, pomegranates, strawberries, tangerines  Fats - Replace butter and margarine with healthy oils, such as olive oil, canola oil, and tahini  - Limit nuts to no more than a handful a day  - Nuts include walnuts, almonds, pecans, pistachios, pine nuts  - Limit or avoid candied, honey roasted or heavily salted nuts - Olives are central to the [employer] - can be eaten whole or used in a variety of dishes   Meats Protein - Limiting red meat: no more than a few times a month - When eating red meat: choose lean cuts and keep the portion to the size of deck of cards - Eggs: approx. 0 to 4 times a week  - Fish and lean poultry: at least 2 a week  - Healthy protein sources include, chicken, [place], lean beef, lamb - Increase intake of seafood such as tuna, salmon, trout, mackerel, shrimp, scallops - Avoid or limit high fat processed meats such as sausage and bacon  Dairy - Include moderate amounts of low fat dairy products  - Focus on healthy dairy such as fat free yogurt, skim milk, low or reduced fat cheese - Limit dairy products higher in fat such as whole or 2% milk, cheese, ice cream  Alcohol - Moderate amounts of red wine is ok  - No more than 5 oz daily for women (all ages) and men older than age 18  - No more than 10 oz of wine daily for men younger than 98  Other - Limit sweets and other desserts  - Use herbs and spices instead of salt to flavor foods  - Herbs and spices common to the traditional Mediterranean Diet include: basil, bay leaves, chives, cloves, cumin, fennel, garlic, lavender, marjoram,  mint, oregano, parsley, pepper, rosemary, sage, savory, sumac, tarragon, thyme   It's not just a diet, it's a lifestyle:  . The Mediterranean diet includes lifestyle factors typical of those in the region  . Foods, drinks and meals are best eaten with others and savored . Daily physical activity is important for overall good health . This could be strenuous exercise like running and aerobics . This could also be more leisurely activities such as walking, housework, yard-work, or taking the stairs . Moderation is the key; a balanced and healthy diet accommodates most foods and drinks . Consider portion sizes and frequency of consumption of certain foods   Meal Ideas & Options:  . Breakfast:  o Whole wheat toast or whole wheat English muffins with peanut butter & hard boiled egg o Steel cut oats topped with apples & cinnamon and skim milk  o Fresh fruit: banana, strawberries, melon, berries, peaches  o Smoothies: strawberries, bananas, greek yogurt, peanut butter o Low fat greek yogurt with blueberries and granola  o Egg white omelet with spinach and mushrooms o Breakfast couscous: whole wheat couscous, apricots, skim milk, cranberries  . Sandwiches:  o Hummus and grilled vegetables (peppers, zucchini, squash) on whole wheat bread   o Grilled chicken on whole wheat pita with lettuce, tomatoes, cucumbers or tzatziki  o Tuna salad on whole wheat bread: tuna salad made with greek yogurt, olives, red peppers, capers, green onions o Garlic rosemary lamb pita: lamb sauted with garlic, rosemary, salt & pepper; add lettuce, cucumber, greek yogurt to pita - flavor with lemon juice and black pepper  .  Seafood:  o Mediterranean grilled salmon, seasoned with garlic, basil, parsley, lemon juice and black pepper o Shrimp, lemon, and spinach whole-grain pasta salad made with low fat greek yogurt  o Seared scallops with lemon orzo  o Seared tuna steaks seasoned salt, pepper, coriander topped with tomato  mixture of olives, tomatoes, olive oil, minced garlic, parsley, green onions and cappers  . Meats:  o Herbed greek chicken salad with kalamata olives, cucumber, feta  o Red bell peppers stuffed with spinach, bulgur, lean ground beef (or lentils) & topped with feta   o Kebabs: skewers of chicken, tomatoes, onions, zucchini, squash  o Turkey burgers: made with red onions, mint, dill, lemon juice, feta cheese topped with roasted red peppers . Vegetarian o Cucumber salad: cucumbers, artichoke hearts, celery, red onion, feta cheese, tossed in olive oil & lemon juice  o Hummus and whole grain pita points with a greek salad (lettuce, tomato, feta, olives, cucumbers, red onion) o Lentil soup with celery, carrots made with vegetable broth, garlic, salt and pepper  o Tabouli salad: parsley, bulgur, mint, scallions, cucumbers, tomato, radishes, lemon juice, olive oil, salt and pepper.      American Heart Association (AHA) Exercise Recommendation  Being physically active is important to prevent heart disease and stroke, the nation's No. 1and No. 5killers. To improve overall cardiovascular health, we suggest at least 150 minutes per week of moderate exercise or 75 minutes per week of vigorous exercise (or a combination of moderate and vigorous activity). Thirty minutes a day, five times a week is an easy goal to remember. You will also experience benefits even if you divide your time into two or three segments of 10 to 15 minutes per day.  For people who would benefit from lowering their blood pressure or cholesterol, we recommend 40 minutes of aerobic exercise of moderate to vigorous intensity three to four times a week to lower the risk for heart attack and stroke.  Physical activity is anything that makes you move your body and burn calories.  This includes things like climbing stairs or playing sports. Aerobic exercises benefit your heart, and include walking, jogging, swimming or biking. Strength and  stretching exercises are best for overall stamina and flexibility.  The simplest, positive change you can make to effectively improve your heart health is to start walking. It's enjoyable, free, easy, social and great exercise. A walking program is flexible and boasts high success rates because people can stick with it. It's easy for walking to become a regular and satisfying part of life.   For Overall Cardiovascular Health:  At least 30 minutes of moderate-intensity aerobic activity at least 5 days per week for a total of 150  OR   At least 25 minutes of vigorous aerobic activity at least 3 days per week for a total of 75 minutes; or a combination of moderate- and vigorous-intensity aerobic activity  AND   Moderate- to high-intensity muscle-strengthening activity at least 2 days per week for additional health benefits.  For Lowering Blood Pressure and Cholesterol  An average 40 minutes of moderate- to vigorous-intensity aerobic activity 3 or 4 times per week  What if I can't make it to the time goal? Something is always better than nothing! And everyone has to start somewhere. Even if you've been sedentary for years, today is the day you can begin to make healthy changes in your life. If you don't think you'll make it for 30 or 40 minutes, set a reachable goal for   today. You can work up toward your overall goal by increasing your time as you get stronger. Don't let all-or-nothing thinking rob you of doing what you can every day.  Source:http://www.heart.org    

## 2018-01-03 NOTE — Addendum Note (Signed)
Addended by: Tresea MallGLEDHILL, Shavaughn Seidl on: 01/03/2018 03:47 PM   Modules accepted: Orders

## 2018-01-03 NOTE — Progress Notes (Addendum)
Patient ID: Alexis Ward, female   DOB: 03/31/1996, 22 y.o.   MRN: [id number]     Gynecology Annual Exam  PCP: System, Provider Not In  Chief Complaint:  Chief Complaint  Patient presents with  . Annual Exam    History of Present Illness: Patient is a 22 y.o. G1P1001 presents for annual exam. The patient has no complaints today.   LMP: No LMP recorded. Patient has had an injection. Menarche:not applicable Average Interval: irregular last period was 5 months ago Duration of flow: 2 days Heavy Menses: no Clots: no Intermenstrual Bleeding: no Postcoital Bleeding: no Dysmenorrhea: no  The patient is sexually active. She currently uses Depo-Provera injections for contraception. She denies dyspareunia.  The patient does not perform self breast exams.  There is possible notable family history of breast or ovarian cancer in her family. Her paternal great aunt was diagnosed with breast cancer at a "younger" age. The patient does not know the exact age. She will find out and let us know.  The patient wears seatbelts: yes.  The patient has regular exercise: She admits being active at work but denies cardio activity. She works at [employer] and she does eat cookies when she is baking. She denies intake of H2O. She drinks soda and sweet tea generally. .    The patient denies current symptoms of depression.    Review of Systems: Review of Systems  Constitutional: Negative.   HENT: Negative.   Eyes: Negative.   Respiratory: Negative.   Cardiovascular: Negative.   Gastrointestinal: Negative.   Genitourinary: Negative.   Musculoskeletal: Negative.   Skin: Negative.   Neurological: Negative.   Endo/Heme/Allergies: Negative.   Psychiatric/Behavioral: Negative.     Past Medical History:  Past Medical History:  Diagnosis Date  . Kidney stone   . Renal disorder     Past Surgical History:  Past Surgical History:  Procedure Laterality Date  . NO PAST SURGERIES      Gynecologic  History:  No LMP recorded. Patient has had an injection. Contraception: Depo-Provera injections Last Pap: 2 years ago Results were:  no abnormalities   Obstetric History: G1P1001  Family History:  History reviewed. No pertinent family history.  Social History:  Social History   Socioeconomic History  . Marital status: Married    Spouse name: Not on file  . Number of children: Not on file  . Years of education: Not on file  . Highest education level: Not on file  Occupational History  . Not on file  Social Needs  . Financial resource strain: Not on file  . Food insecurity:    Worry: Not on file    Inability: Not on file  . Transportation needs:    Medical: Not on file    Non-medical: Not on file  Tobacco Use  . Smoking status: Never Smoker  . Smokeless tobacco: Never Used  Substance and Sexual Activity  . Alcohol use: No  . Drug use: No  . Sexual activity: Yes    Birth control/protection: Injection  Lifestyle  . Physical activity:    Days per week: Not on file    Minutes per session: Not on file  . Stress: Not on file  Relationships  . Social connections:    Talks on phone: Not on file    Gets together: Not on file    Attends religious service: Not on file    Active member of club or organization: Not on file    Attends meetings  of clubs or organizations: Not on file    Relationship status: Not on file  . Intimate partner violence:    Fear of current or ex partner: Not on file    Emotionally abused: Not on file    Physically abused: Not on file    Forced sexual activity: Not on file  Other Topics Concern  . Not on file  Social History Narrative  . Not on file    Allergies:  No Known Allergies  Medications: Prior to Admission medications   Medication Sig Start Date End Date Taking? Authorizing Provider  medroxyPROGESTERone Acetate 150 MG/ML SUSY Inject 1 mL (150 mg total) into the muscle every 3 (three) months. 01/03/18  Yes Tresea Mall, CNM     Physical Exam Vitals: Blood pressure 122/74, height 5\' 2"  (1.575 m), weight 167 lb (75.8 kg).  General: NAD HEENT: normocephalic, anicteric Thyroid: no enlargement, no palpable nodules Pulmonary: No increased work of breathing, CTAB Cardiovascular: RRR, distal pulses 2+ Breast: Breast symmetrical, no tenderness, no palpable nodules or masses, no skin or nipple retraction present, no nipple discharge.  No axillary or supraclavicular lymphadenopathy. Abdomen: NABS, soft, non-tender, non-distended.  Umbilicus without lesions.  No hepatomegaly, splenomegaly or masses palpable. No evidence of hernia  Genitourinary:  External: Normal external female genitalia.  Normal urethral meatus, normal Bartholin's and Skene's glands.    Vagina: Normal vaginal mucosa, no evidence of prolapse.    Cervix: Grossly normal in appearance, no bleeding, no CMT  Uterus: deferred for no symptoms/shared decision making  Adnexa: deferred for no symptoms/shared decision making  Rectal: deferred  Lymphatic: no evidence of inguinal lymphadenopathy Extremities: no edema, erythema, or tenderness Neurologic: Grossly intact Psychiatric: mood appropriate, affect full   Assessment: 22 y.o. G1P1001 routine annual exam  Plan: Problem List Items Addressed This Visit    None    Visit Diagnoses    Well woman exam with routine gynecological exam    -  Primary   Relevant Orders   IGP, rfx Aptima HPV ASCU   Cervical cancer screening       Relevant Orders   IGP, rfx Aptima HPV ASCU   Encounter for surveillance of injectable contraceptive       Relevant Medications   medroxyPROGESTERone Acetate 150 MG/ML SUSY      1) 4) Gardasil Series discussed and if applicable offered to patient - Patient has previously completed 3 shot series   2) STI screening  was offered and declined  3)  ASCCP guidelines and rational discussed.  Patient opts for every 1-2 year screening interval  4) Contraception - the patient is  currently using  Depo-Provera injections.  She is happy with her current form of contraception and plans to continue We discussed safe sex practices to reduce her furture risk of STI's.    5) Increase healthy lifestyle diet, hydration, exercise to establish healthy habits at a young age. See AVS for other recommendations  6) Return in 1 year (on 01/04/2019) for annual established gyn.   Tresea Mall, CNM [facility], [facility] 01/03/2018, 3:46 PM

## 2018-01-07 LAB — IGP, RFX APTIMA HPV ASCU: PAP SMEAR COMMENT: 0

## 2018-03-13 ENCOUNTER — Ambulatory Visit: Payer: Medicaid Other

## 2018-03-15 ENCOUNTER — Ambulatory Visit (INDEPENDENT_AMBULATORY_CARE_PROVIDER_SITE_OTHER): Payer: Medicaid Other

## 2018-03-15 DIAGNOSIS — Z3042 Encounter for surveillance of injectable contraceptive: Secondary | ICD-10-CM

## 2018-03-15 DIAGNOSIS — Z3049 Encounter for surveillance of other contraceptives: Secondary | ICD-10-CM

## 2018-03-15 MED ORDER — MEDROXYPROGESTERONE ACETATE 150 MG/ML IM SUSP
150.0000 mg | Freq: Once | INTRAMUSCULAR | Status: AC
  Administered 2018-03-15: 150 mg via INTRAMUSCULAR

## 2018-06-07 ENCOUNTER — Ambulatory Visit: Payer: Medicaid Other

## 2018-06-10 ENCOUNTER — Ambulatory Visit: Payer: Medicaid Other

## 2018-06-11 ENCOUNTER — Ambulatory Visit (INDEPENDENT_AMBULATORY_CARE_PROVIDER_SITE_OTHER): Payer: Medicaid Other

## 2018-06-11 DIAGNOSIS — Z3049 Encounter for surveillance of other contraceptives: Secondary | ICD-10-CM

## 2018-06-11 DIAGNOSIS — Z3042 Encounter for surveillance of injectable contraceptive: Secondary | ICD-10-CM

## 2018-06-11 MED ORDER — MEDROXYPROGESTERONE ACETATE 150 MG/ML IM SUSP
150.0000 mg | Freq: Once | INTRAMUSCULAR | Status: AC
  Administered 2018-06-11: 150 mg via INTRAMUSCULAR

## 2018-08-19 ENCOUNTER — Telehealth: Payer: Self-pay

## 2018-08-19 NOTE — Telephone Encounter (Signed)
Pt and hsb planning baby #2.  How does she go about stopping bc; does she need to let us know?; does she need to call pharm and stop rx?  [phone]  Adv she doesn't need to let us know and she doesn't need to cxl rx.  Just simply stop taking her bc.  Adv we do like for pts to have at least one period on their own before conceiving.

## 2018-09-03 ENCOUNTER — Ambulatory Visit: Payer: Self-pay

## 2018-09-25 NOTE — L&D Delivery Note (Signed)
Date of delivery: 07/21/2019 Estimated Date of Delivery: 07/27/19 Patient's last menstrual period was 11/01/2018 (approximate). EGA: [redacted]w[redacted]d  Delivery Note At 8:41 PM a viable female was delivered via Vaginal, Spontaneous (Presentation: OA;  ROA).  APGAR: 8, 9; weight: 3170 g.   Placenta status: spontaneous, intact.  Cord:  with the following complications: marginal insertion.  Cord pH: NA  Anesthesia:  epidural Episiotomy: None Lacerations: bilateral labial abrasions repaired with 4.0 vicryl, Small 1st degree perineal hemostatic- not repaired Est. Blood Loss (mL): 400  Mom to postpartum.  Baby to Couplet care / Skin to Skin.  Rod Can, CNM 07/21/2019, 9:48 PM

## 2018-10-22 ENCOUNTER — Telehealth: Payer: Self-pay

## 2018-10-22 NOTE — Telephone Encounter (Signed)
Pt needs to talk about side effects from being off bc.  How are periods supposed to be off bc?  She went off around the middle of December; about a week and a half ago she had period and bled for 6d; off a week; now started back very heavy.  [phone]

## 2018-10-23 NOTE — Telephone Encounter (Signed)
Spoke with patient regarding normal adjustment time when changing hormones. She had been on Depo for 7 years.

## 2018-12-05 ENCOUNTER — Ambulatory Visit (INDEPENDENT_AMBULATORY_CARE_PROVIDER_SITE_OTHER): Payer: 59 | Admitting: Obstetrics & Gynecology

## 2018-12-05 ENCOUNTER — Other Ambulatory Visit: Payer: Self-pay

## 2018-12-05 ENCOUNTER — Encounter: Payer: Self-pay | Admitting: Obstetrics & Gynecology

## 2018-12-05 VITALS — BP 120/60 | HR 80 | Wt 174.0 lb

## 2018-12-05 DIAGNOSIS — Z113 Encounter for screening for infections with a predominantly sexual mode of transmission: Secondary | ICD-10-CM

## 2018-12-05 DIAGNOSIS — N926 Irregular menstruation, unspecified: Secondary | ICD-10-CM

## 2018-12-05 DIAGNOSIS — Z3A01 Less than 8 weeks gestation of pregnancy: Secondary | ICD-10-CM

## 2018-12-05 DIAGNOSIS — Z3201 Encounter for pregnancy test, result positive: Secondary | ICD-10-CM

## 2018-12-05 DIAGNOSIS — Z3491 Encounter for supervision of normal pregnancy, unspecified, first trimester: Secondary | ICD-10-CM

## 2018-12-05 LAB — POCT URINE PREGNANCY: PREG TEST UR: POSITIVE — AB

## 2018-12-05 LAB — OB RESULTS CONSOLE VARICELLA ZOSTER ANTIBODY, IGG: Varicella: NON-IMMUNE/NOT IMMUNE

## 2018-12-05 NOTE — Addendum Note (Signed)
Addended by: Cornelius Moras D on: 12/05/2018 10:35 AM   Modules accepted: Orders

## 2018-12-05 NOTE — Patient Instructions (Signed)
First Trimester of Pregnancy  The first trimester of pregnancy is from week 1 until the end of week 13 (months 1 through 3). A week after a sperm fertilizes an egg, the egg will implant on the wall of the uterus. This embryo will begin to develop into a baby. Genes from you and your partner will form the baby. The female genes will determine whether the baby will be a boy or a girl. At 6-8 weeks, the eyes and face will be formed, and the heartbeat can be seen on ultrasound. At the end of 12 weeks, all the baby's organs will be formed.  Now that you are pregnant, you will want to do everything you can to have a healthy baby. Two of the most important things are to get good prenatal care and to follow your health care provider's instructions. Prenatal care is all the medical care you receive before the baby's birth. This care will help prevent, find, and treat any problems during the pregnancy and childbirth.  Body changes during your first trimester  Your body goes through many changes during pregnancy. The changes vary from woman to woman.   You may gain or lose a couple of pounds at first.   You may feel sick to your stomach (nauseous) and you may throw up (vomit). If the vomiting is uncontrollable, call your health care provider.   You may tire easily.   You may develop headaches that can be relieved by medicines. All medicines should be approved by your health care provider.   You may urinate more often. Painful urination may mean you have a bladder infection.   You may develop heartburn as a result of your pregnancy.   You may develop constipation because certain hormones are causing the muscles that push stool through your intestines to slow down.   You may develop hemorrhoids or swollen veins (varicose veins).   Your breasts may begin to grow larger and become tender. Your nipples may stick out more, and the tissue that surrounds them (areola) may become darker.   Your gums may bleed and may be  sensitive to brushing and flossing.   Dark spots or blotches (chloasma, mask of pregnancy) may develop on your face. This will likely fade after the baby is born.   Your menstrual periods will stop.   You may have a loss of appetite.   You may develop cravings for certain kinds of food.   You may have changes in your emotions from day to day, such as being excited to be pregnant or being concerned that something may go wrong with the pregnancy and baby.   You may have more vivid and strange dreams.   You may have changes in your hair. These can include thickening of your hair, rapid growth, and changes in texture. Some women also have hair loss during or after pregnancy, or hair that feels dry or thin. Your hair will most likely return to normal after your baby is born.  What to expect at prenatal visits  During a routine prenatal visit:   You will be weighed to make sure you and the baby are growing normally.   Your blood pressure will be taken.   Your abdomen will be measured to track your baby's growth.   The fetal heartbeat will be listened to between weeks 10 and 14 of your pregnancy.   Test results from any previous visits will be discussed.  Your health care provider may ask you:     How you are feeling.   If you are feeling the baby move.   If you have had any abnormal symptoms, such as leaking fluid, bleeding, severe headaches, or abdominal cramping.   If you are using any tobacco products, including cigarettes, chewing tobacco, and electronic cigarettes.   If you have any questions.  Other tests that may be performed during your first trimester include:   Blood tests to find your blood type and to check for the presence of any previous infections. The tests will also be used to check for low iron levels (anemia) and protein on red blood cells (Rh antibodies). Depending on your risk factors, or if you previously had diabetes during pregnancy, you may have tests to check for high blood sugar  that affects pregnant women (gestational diabetes).   Urine tests to check for infections, diabetes, or protein in the urine.   An ultrasound to confirm the proper growth and development of the baby.   Fetal screens for spinal cord problems (spina bifida) and Down syndrome.   HIV (human immunodeficiency virus) testing. Routine prenatal testing includes screening for HIV, unless you choose not to have this test.   You may need other tests to make sure you and the baby are doing well.  Follow these instructions at home:  Medicines   Follow your health care provider's instructions regarding medicine use. Specific medicines may be either safe or unsafe to take during pregnancy.   Take a prenatal vitamin that contains at least 600 micrograms (mcg) of folic acid.   If you develop constipation, try taking a stool softener if your health care provider approves.  Eating and drinking     Eat a balanced diet that includes fresh fruits and vegetables, whole grains, good sources of protein such as meat, eggs, or tofu, and low-fat dairy. Your health care provider will help you determine the amount of weight gain that is right for you.   Avoid raw meat and uncooked cheese. These carry germs that can cause birth defects in the baby.   Eating four or five small meals rather than three large meals a day may help relieve nausea and vomiting. If you start to feel nauseous, eating a few soda crackers can be helpful. Drinking liquids between meals, instead of during meals, also seems to help ease nausea and vomiting.   Limit foods that are high in fat and processed sugars, such as fried and sweet foods.   To prevent constipation:  ? Eat foods that are high in fiber, such as fresh fruits and vegetables, whole grains, and beans.  ? Drink enough fluid to keep your urine clear or pale yellow.  Activity   Exercise only as directed by your health care provider. Most women can continue their usual exercise routine during  pregnancy. Try to exercise for 30 minutes at least 5 days a week. Exercising will help you:  ? Control your weight.  ? Stay in shape.  ? Be prepared for labor and delivery.   Experiencing pain or cramping in the lower abdomen or lower back is a good sign that you should stop exercising. Check with your health care provider before continuing with normal exercises.   Try to avoid standing for long periods of time. Move your legs often if you must stand in one place for a long time.   Avoid heavy lifting.   Wear low-heeled shoes and practice good posture.   You may continue to have sex unless your health care   provider tells you not to.  Relieving pain and discomfort   Wear a good support bra to relieve breast tenderness.   Take warm sitz baths to soothe any pain or discomfort caused by hemorrhoids. Use hemorrhoid cream if your health care provider approves.   Rest with your legs elevated if you have leg cramps or low back pain.   If you develop varicose veins in your legs, wear support hose. Elevate your feet for 15 minutes, 3-4 times a day. Limit salt in your diet.  Prenatal care   Schedule your prenatal visits by the twelfth week of pregnancy. They are usually scheduled monthly at first, then more often in the last 2 months before delivery.   Write down your questions. Take them to your prenatal visits.   Keep all your prenatal visits as told by your health care provider. This is important.  Safety   Wear your seat belt at all times when driving.   Make a list of emergency phone numbers, including numbers for family, friends, the hospital, and police and fire departments.  General instructions   Ask your health care provider for a referral to a local prenatal education class. Begin classes no later than the beginning of month 6 of your pregnancy.   Ask for help if you have counseling or nutritional needs during pregnancy. Your health care provider can offer advice or refer you to specialists for help  with various needs.   Do not use hot tubs, steam rooms, or saunas.   Do not douche or use tampons or scented sanitary pads.   Do not cross your legs for long periods of time.   Avoid cat litter boxes and soil used by cats. These carry germs that can cause birth defects in the baby and possibly loss of the fetus by miscarriage or stillbirth.   Avoid all smoking, herbs, alcohol, and medicines not prescribed by your health care provider. Chemicals in these products affect the formation and growth of the baby.   Do not use any products that contain nicotine or tobacco, such as cigarettes and e-cigarettes. If you need help quitting, ask your health care provider. You may receive counseling support and other resources to help you quit.   Schedule a dentist appointment. At home, brush your teeth with a soft toothbrush and be gentle when you floss.  Contact a health care provider if:   You have dizziness.   You have mild pelvic cramps, pelvic pressure, or nagging pain in the abdominal area.   You have persistent nausea, vomiting, or diarrhea.   You have a bad smelling vaginal discharge.   You have pain when you urinate.   You notice increased swelling in your face, hands, legs, or ankles.   You are exposed to fifth disease or chickenpox.   You are exposed to German measles (rubella) and have never had it.  Get help right away if:   You have a fever.   You are leaking fluid from your vagina.   You have spotting or bleeding from your vagina.   You have severe abdominal cramping or pain.   You have rapid weight gain or loss.   You vomit blood or material that looks like coffee grounds.   You develop a severe headache.   You have shortness of breath.   You have any kind of trauma, such as from a fall or a car accident.  Summary   The first trimester of pregnancy is from week 1 until   the end of week 13 (months 1 through 3).   Your body goes through many changes during pregnancy. The changes vary from  woman to woman.   You will have routine prenatal visits. During those visits, your health care provider will examine you, discuss any test results you may have, and talk with you about how you are feeling.  This information is not intended to replace advice given to you by your health care provider. Make sure you discuss any questions you have with your health care provider.  Document Released: 09/05/2001 Document Revised: 08/23/2016 Document Reviewed: 08/23/2016  Elsevier Interactive Patient Education  2019 [employer]

## 2018-12-05 NOTE — Progress Notes (Signed)
12/05/2018   Chief Complaint: Missed period  Transfer of Care Patient: no  History of Present Illness: Alexis Ward is a 23 y.o. G1P1001 newly pregnant with an Estimated Date of Delivery: 08/09/2019, with the above CC.   Her periods were: irregular periods with LAST DEPO USE DEC 2019 AND she has had irreg periods in Jan and Feb (2 in Feb - 11/01/18 lasting for 10 days, then 3 days off, then another one for 2 days) She was using no method when she conceived.  She has Positive signs or symptoms of nausea/vomiting of pregnancy. She has Negative signs or symptoms of miscarriage or preterm labor She identifies Negative Zika risk factors for her and her partner On any different medications around the time she conceived/early pregnancy: No  History of varicella: Yes   ROS: A 12-point review of systems was performed and negative, except as stated in the above HPI.  OBGYN History: As per HPI. OB History  Gravida Para Term Preterm AB Living  1 1 1     1   SAB TAB Ectopic Multiple Live Births        0 1    # Outcome Date GA Lbr Len/2nd Weight Sex Delivery Anes PTL Lv  1 Term 05/14/15 [redacted]w[redacted]d 06:10 / 01:00 6 lb 7.4 oz (2.93 kg) F Vag-Spont EPI  LIV    Any issues with any prior pregnancies: no Any prior children are healthy, doing well, without any problems or issues: yes History of pap smears: Yes. Last pap smear 12/2017. Abnormal: no  History of STIs: No   Past Medical History: Past Medical History:  Diagnosis Date  . Kidney stone   . Renal disorder    Past Surgical History: Past Surgical History:  Procedure Laterality Date  . NO PAST SURGERIES     Family History:  No family history on file. She denies any female cancers, bleeding or blood clotting disorders.  She denies any history of mental retardation, birth defects or genetic disorders in her or the FOB's history  Social History:  Social History   Socioeconomic History  . Marital status: Married    Spouse name: Not on file  .  Number of children: Not on file  . Years of education: Not on file  . Highest education level: Not on file  Occupational History  . Not on file  Social Needs  . Financial resource strain: Not on file  . Food insecurity:    Worry: Not on file    Inability: Not on file  . Transportation needs:    Medical: Not on file    Non-medical: Not on file  Tobacco Use  . Smoking status: Never Smoker  . Smokeless tobacco: Never Used  Substance and Sexual Activity  . Alcohol use: No  . Drug use: No  . Sexual activity: Yes    Birth control/protection: Injection  Lifestyle  . Physical activity:    Days per week: Not on file    Minutes per session: Not on file  . Stress: Not on file  Relationships  . Social connections:    Talks on phone: Not on file    Gets together: Not on file    Attends religious service: Not on file    Active member of club or organization: Not on file    Attends meetings of clubs or organizations: Not on file    Relationship status: Not on file  . Intimate partner violence:    Fear of current or ex partner:  Not on file    Emotionally abused: Not on file    Physically abused: Not on file    Forced sexual activity: Not on file  Other Topics Concern  . Not on file  Social History Narrative  . Not on file   Any pets in the household: no  Allergy: No Known Allergies  Current Outpatient Medications:  Current Outpatient Medications:  .  medroxyPROGESTERone Acetate 150 MG/ML SUSY, Inject 1 mL (150 mg total) into the muscle every 3 (three) months., Disp: 1 mL, Rfl: 3  Physical Exam:   There were no vitals taken for this visit. There is no height or weight on file to calculate BMI. Constitutional: Well nourished, well developed female in no acute distress.  Neck:  Supple, normal appearance, and no thyromegaly  Cardiovascular: S1, S2 normal, no murmur, rub or gallop, regular rate and rhythm Respiratory:  Clear to auscultation bilateral. Normal respiratory effort  Abdomen: positive bowel sounds and no masses, hernias; diffusely non tender to palpation, non distended Breasts: breasts appear normal, no suspicious masses, no skin or nipple changes or axillary nodes. Neuro/Psych:  Normal mood and affect.  Skin:  Warm and dry.  Lymphatic:  No inguinal lymphadenopathy.   Pelvic exam: is not limited by body habitus EGBUS: within normal limits, Vagina: within normal limits and with no blood in the vault, Cervix: normal appearing cervix without discharge or lesions, closed/long/high, Uterus:  enlarged: 6 weeks, and Adnexa:  no mass, fullness, tenderness  Assessment: Alexis Ward is a 23 y.o. G1P1001 irregular menses but a EDC of 08/09/2019 based on LMP of 11/01/18,  for prenatal care.  Plan:  1) Avoid alcoholic beverages. 2) Patient encouraged not to smoke.  3) Discontinue the use of all non-medicinal drugs and chemicals.  4) Take prenatal vitamins daily.  5) Seatbelt use advised 6) Nutrition, food safety (fish, cheese advisories, and high nitrite foods) and exercise discussed. 7) Hospital and practice style delivering at [facility] discussed  8) Patient is asked about travel to areas at risk for the Zika virus, and counseled to avoid travel and exposure to mosquitoes or sexual partners who may have themselves been exposed to the virus. Testing is discussed, and will be ordered as appropriate.  9) [facility]birth classes at [facility] advised 10) Genetic Screening, such as with 1st Trimester Screening, cell free fetal DNA, AFP testing, and Ultrasound, as well as with amniocentesis and CVS as appropriate, is discussed with patient. She plans to consider genetic testing this pregnancy. 11) [place] soon for dating, as irreg periods after last Depo shot Dec 2019 12) Bottle feeding plans  Problem list reviewed and updated.  Annamarie Major, MD, [facility], [facility] 12/05/2018  10:16 AM

## 2018-12-06 LAB — RPR+RH+ABO+RUB AB+AB SCR+CB...
Antibody Screen: NEGATIVE
HIV Screen 4th Generation wRfx: NONREACTIVE
Hematocrit: 35.8 % (ref 34.0–46.6)
Hemoglobin: 12.1 g/dL (ref 11.1–15.9)
Hepatitis B Surface Ag: NEGATIVE
MCH: 29.1 pg (ref 26.6–33.0)
MCHC: 33.8 g/dL (ref 31.5–35.7)
MCV: 86 fL (ref 79–97)
PLATELETS: 210 10*3/uL (ref 150–450)
RBC: 4.16 x10E6/uL (ref 3.77–5.28)
RDW: 12 % (ref 11.7–15.4)
RPR Ser Ql: NONREACTIVE
Rh Factor: POSITIVE
Rubella Antibodies, IGG: 4.14 index (ref >0.99)
WBC: 6 10*3/uL (ref 3.4–10.8)

## 2018-12-07 LAB — URINE CULTURE

## 2018-12-09 LAB — GC/CHLAMYDIA PROBE AMP
CHLAMYDIA, DNA PROBE: NEGATIVE
NEISSERIA GONORRHOEAE BY PCR: NEGATIVE

## 2018-12-16 ENCOUNTER — Ambulatory Visit: Admission: RE | Admit: 2018-12-16 | Payer: 59 | Source: Ambulatory Visit

## 2018-12-16 ENCOUNTER — Ambulatory Visit: Payer: 59

## 2018-12-16 ENCOUNTER — Ambulatory Visit (INDEPENDENT_AMBULATORY_CARE_PROVIDER_SITE_OTHER): Payer: 59 | Admitting: Maternal Newborn

## 2018-12-16 ENCOUNTER — Ambulatory Visit (INDEPENDENT_AMBULATORY_CARE_PROVIDER_SITE_OTHER): Payer: 59

## 2018-12-16 ENCOUNTER — Other Ambulatory Visit: Payer: Self-pay

## 2018-12-16 ENCOUNTER — Encounter: Payer: Self-pay | Admitting: Maternal Newborn

## 2018-12-16 VITALS — BP 100/60 | Wt 174.0 lb

## 2018-12-16 DIAGNOSIS — Z3A01 Less than 8 weeks gestation of pregnancy: Secondary | ICD-10-CM

## 2018-12-16 DIAGNOSIS — O3680X0 Pregnancy with inconclusive fetal viability, not applicable or unspecified: Secondary | ICD-10-CM

## 2018-12-16 DIAGNOSIS — Z3490 Encounter for supervision of normal pregnancy, unspecified, unspecified trimester: Secondary | ICD-10-CM

## 2018-12-16 DIAGNOSIS — N926 Irregular menstruation, unspecified: Secondary | ICD-10-CM

## 2018-12-16 NOTE — Progress Notes (Signed)
ROB and U/S- no concerns 

## 2018-12-16 NOTE — Progress Notes (Signed)
    Routine Prenatal Care Visit  Subjective  Alexis Ward is a 23 y.o. G2P1001 at 103w3d being seen today for ongoing prenatal care.  She is currently monitored for the following issues for this low-risk pregnancy and has Supervision of low-risk pregnancy, unspecified trimester on their problem list.  ----------------------------------------------------------------------------------- Patient reports some nausea, but better than her last pregnancy and she does not desire medication now.   Vag. Bleeding: None.   ----------------------------------------------------------------------------------- The following portions of the patient's history were reviewed and updated as appropriate: allergies, current medications, past family history, past medical history, past social history, past surgical history and problem list. Problem list updated.  Objective  Blood pressure 100/60, weight 174 lb (78.9 kg), last menstrual period 11/01/2018. Pregravid weight 175 lb (79.4 kg) Total Weight Gain -1 lb (-0.454 kg)   Fetal Status: Fetal Heart Rate (bpm): 170         General:  Alert, oriented and cooperative. Patient is in no acute distress.  Skin: Skin is warm and dry. No rash noted.   Cardiovascular: Normal heart rate noted  Respiratory: Normal respiratory effort, no problems with respiration noted  Abdomen: Soft, gravid, appropriate for gestational age. Pain/Pressure: Absent     Pelvic:  Cervical exam deferred        Extremities: Normal range of motion.  Edema: None  Mental Status: Normal mood and affect. Normal behavior. Normal judgment and thought content.    Assessment   23 y.o. G2P1001 at [redacted]w[redacted]d, EDD 08/08/2019 by Last Menstrual Period presenting for a routine prenatal visit.  Plan   pregnancy2 Problems (from 10/26/18 to present)    Problem Noted Resolved   Supervision of low-risk pregnancy, unspecified trimester 12/16/2018 by Oswaldo Conroy, CNM No   Overview Addendum 12/16/2018  2:52 PM by  Oswaldo Conroy, CNM    Clinic [facility] Prenatal Labs  Dating  Blood type: A/Positive/-- (03/12 1022)   Genetic Screen 1 Screen:    AFP:     Quad:     NIPS: Antibody:Negative (03/12 1022)  Anatomic [place]  Rubella: 4.14 (03/12 1022) Varicella: Non-immune  GTT Early:               Third trimester:  RPR: Non Reactive (03/12 1022)   Rhogam  HBsAg: Negative (03/12 1022)   TDaP vaccine                       Flu Shot: HIV: Non Reactive (03/12 1022)   Baby Food                                GBS:   Contraception  Pap: 01/03/2018, NILM  CBB     CS/VBAC    Support Person               Dating scan today shows singleton IUP at 8w 1d, which is 1 week, 5 days earlier than LMP dating.  Dating changed based on ACOG guidelines, new EDD is 07/27/2019. Findings discussed with patient.  She has decided to decline genetic screening.  Please refer to After Visit Summary for other counseling recommendations.   Return in about 4 weeks (around 01/13/2019) for ROB/telephone visit.  Marcelyn Bruins, CNM 12/16/2018

## 2018-12-16 NOTE — Patient Instructions (Signed)
First Trimester of Pregnancy  The first trimester of pregnancy is from week 1 until the end of week 13 (months 1 through 3). A week after a sperm fertilizes an egg, the egg will implant on the wall of the uterus. This embryo will begin to develop into a baby. Genes from you and your partner will form the baby. The female genes will determine whether the baby will be a boy or a girl. At 6-8 weeks, the eyes and face will be formed, and the heartbeat can be seen on ultrasound. At the end of 12 weeks, all the baby's organs will be formed.  Now that you are pregnant, you will want to do everything you can to have a healthy baby. Two of the most important things are to get good prenatal care and to follow your health care provider's instructions. Prenatal care is all the medical care you receive before the baby's birth. This care will help prevent, find, and treat any problems during the pregnancy and childbirth.  Body changes during your first trimester  Your body goes through many changes during pregnancy. The changes vary from woman to woman.   You may gain or lose a couple of pounds at first.   You may feel sick to your stomach (nauseous) and you may throw up (vomit). If the vomiting is uncontrollable, call your health care provider.   You may tire easily.   You may develop headaches that can be relieved by medicines. All medicines should be approved by your health care provider.   You may urinate more often. Painful urination may mean you have a bladder infection.   You may develop heartburn as a result of your pregnancy.   You may develop constipation because certain hormones are causing the muscles that push stool through your intestines to slow down.   You may develop hemorrhoids or swollen veins (varicose veins).   Your breasts may begin to grow larger and become tender. Your nipples may stick out more, and the tissue that surrounds them (areola) may become darker.   Your gums may bleed and may be  sensitive to brushing and flossing.   Dark spots or blotches (chloasma, mask of pregnancy) may develop on your face. This will likely fade after the baby is born.   Your menstrual periods will stop.   You may have a loss of appetite.   You may develop cravings for certain kinds of food.   You may have changes in your emotions from day to day, such as being excited to be pregnant or being concerned that something may go wrong with the pregnancy and baby.   You may have more vivid and strange dreams.   You may have changes in your hair. These can include thickening of your hair, rapid growth, and changes in texture. Some women also have hair loss during or after pregnancy, or hair that feels dry or thin. Your hair will most likely return to normal after your baby is born.  What to expect at prenatal visits  During a routine prenatal visit:   You will be weighed to make sure you and the baby are growing normally.   Your blood pressure will be taken.   Your abdomen will be measured to track your baby's growth.   The fetal heartbeat will be listened to between weeks 10 and 14 of your pregnancy.   Test results from any previous visits will be discussed.  Your health care provider may ask you:     How you are feeling.   If you are feeling the baby move.   If you have had any abnormal symptoms, such as leaking fluid, bleeding, severe headaches, or abdominal cramping.   If you are using any tobacco products, including cigarettes, chewing tobacco, and electronic cigarettes.   If you have any questions.  Other tests that may be performed during your first trimester include:   Blood tests to find your blood type and to check for the presence of any previous infections. The tests will also be used to check for low iron levels (anemia) and protein on red blood cells (Rh antibodies). Depending on your risk factors, or if you previously had diabetes during pregnancy, you may have tests to check for high blood sugar  that affects pregnant women (gestational diabetes).   Urine tests to check for infections, diabetes, or protein in the urine.   An ultrasound to confirm the proper growth and development of the baby.   Fetal screens for spinal cord problems (spina bifida) and Down syndrome.   HIV (human immunodeficiency virus) testing. Routine prenatal testing includes screening for HIV, unless you choose not to have this test.   You may need other tests to make sure you and the baby are doing well.  Follow these instructions at home:  Medicines   Follow your health care provider's instructions regarding medicine use. Specific medicines may be either safe or unsafe to take during pregnancy.   Take a prenatal vitamin that contains at least 600 micrograms (mcg) of folic acid.   If you develop constipation, try taking a stool softener if your health care provider approves.  Eating and drinking     Eat a balanced diet that includes fresh fruits and vegetables, whole grains, good sources of protein such as meat, eggs, or tofu, and low-fat dairy. Your health care provider will help you determine the amount of weight gain that is right for you.   Avoid raw meat and uncooked cheese. These carry germs that can cause birth defects in the baby.   Eating four or five small meals rather than three large meals a day may help relieve nausea and vomiting. If you start to feel nauseous, eating a few soda crackers can be helpful. Drinking liquids between meals, instead of during meals, also seems to help ease nausea and vomiting.   Limit foods that are high in fat and processed sugars, such as fried and sweet foods.   To prevent constipation:  ? Eat foods that are high in fiber, such as fresh fruits and vegetables, whole grains, and beans.  ? Drink enough fluid to keep your urine clear or pale yellow.  Activity   Exercise only as directed by your health care provider. Most women can continue their usual exercise routine during  pregnancy. Try to exercise for 30 minutes at least 5 days a week. Exercising will help you:  ? Control your weight.  ? Stay in shape.  ? Be prepared for labor and delivery.   Experiencing pain or cramping in the lower abdomen or lower back is a good sign that you should stop exercising. Check with your health care provider before continuing with normal exercises.   Try to avoid standing for long periods of time. Move your legs often if you must stand in one place for a long time.   Avoid heavy lifting.   Wear low-heeled shoes and practice good posture.   You may continue to have sex unless your health care   provider tells you not to.  Relieving pain and discomfort   Wear a good support bra to relieve breast tenderness.   Take warm sitz baths to soothe any pain or discomfort caused by hemorrhoids. Use hemorrhoid cream if your health care provider approves.   Rest with your legs elevated if you have leg cramps or low back pain.   If you develop varicose veins in your legs, wear support hose. Elevate your feet for 15 minutes, 3-4 times a day. Limit salt in your diet.  Prenatal care   Schedule your prenatal visits by the twelfth week of pregnancy. They are usually scheduled monthly at first, then more often in the last 2 months before delivery.   Write down your questions. Take them to your prenatal visits.   Keep all your prenatal visits as told by your health care provider. This is important.  Safety   Wear your seat belt at all times when driving.   Make a list of emergency phone numbers, including numbers for family, friends, the hospital, and police and fire departments.  General instructions   Ask your health care provider for a referral to a local prenatal education class. Begin classes no later than the beginning of month 6 of your pregnancy.   Ask for help if you have counseling or nutritional needs during pregnancy. Your health care provider can offer advice or refer you to specialists for help  with various needs.   Do not use hot tubs, steam rooms, or saunas.   Do not douche or use tampons or scented sanitary pads.   Do not cross your legs for long periods of time.   Avoid cat litter boxes and soil used by cats. These carry germs that can cause birth defects in the baby and possibly loss of the fetus by miscarriage or stillbirth.   Avoid all smoking, herbs, alcohol, and medicines not prescribed by your health care provider. Chemicals in these products affect the formation and growth of the baby.   Do not use any products that contain nicotine or tobacco, such as cigarettes and e-cigarettes. If you need help quitting, ask your health care provider. You may receive counseling support and other resources to help you quit.   Schedule a dentist appointment. At home, brush your teeth with a soft toothbrush and be gentle when you floss.  Contact a health care provider if:   You have dizziness.   You have mild pelvic cramps, pelvic pressure, or nagging pain in the abdominal area.   You have persistent nausea, vomiting, or diarrhea.   You have a bad smelling vaginal discharge.   You have pain when you urinate.   You notice increased swelling in your face, hands, legs, or ankles.   You are exposed to fifth disease or chickenpox.   You are exposed to German measles (rubella) and have never had it.  Get help right away if:   You have a fever.   You are leaking fluid from your vagina.   You have spotting or bleeding from your vagina.   You have severe abdominal cramping or pain.   You have rapid weight gain or loss.   You vomit blood or material that looks like coffee grounds.   You develop a severe headache.   You have shortness of breath.   You have any kind of trauma, such as from a fall or a car accident.  Summary   The first trimester of pregnancy is from week 1 until   the end of week 13 (months 1 through 3).   Your body goes through many changes during pregnancy. The changes vary from  woman to woman.   You will have routine prenatal visits. During those visits, your health care provider will examine you, discuss any test results you may have, and talk with you about how you are feeling.  This information is not intended to replace advice given to you by your health care provider. Make sure you discuss any questions you have with your health care provider.  Document Released: 09/05/2001 Document Revised: 08/23/2016 Document Reviewed: 08/23/2016  Elsevier Interactive Patient Education  2019 [employer]

## 2018-12-20 ENCOUNTER — Telehealth: Payer: Self-pay

## 2018-12-20 ENCOUNTER — Other Ambulatory Visit: Payer: Self-pay | Admitting: Maternal Newborn

## 2018-12-20 DIAGNOSIS — R110 Nausea: Principal | ICD-10-CM

## 2018-12-20 DIAGNOSIS — O26899 Other specified pregnancy related conditions, unspecified trimester: Secondary | ICD-10-CM

## 2018-12-20 MED ORDER — DOXYLAMINE-PYRIDOXINE 10-10 MG PO TBEC: 2.0000 | DELAYED_RELEASE_TABLET | Freq: Every day | ORAL | 5 refills | Status: DC

## 2018-12-20 NOTE — Telephone Encounter (Signed)
Pt aware rx sent in and is aware to call if problems c coverage.

## 2018-12-20 NOTE — Telephone Encounter (Signed)
Pt states ins does not cover generic diclegis and is $500.  Is there anything else she can do for nausea?  [phone]  Adv Vitamin B6 25mg  tid c meals and Unisom 25-50mg  at bedtime.

## 2018-12-20 NOTE — Telephone Encounter (Signed)
Rx sent to pharmacy for Diclegis generic. If they won't cover it, please let her know about OTC Unisom and B6 so she can try that instead.

## 2018-12-20 NOTE — Progress Notes (Signed)
Rx for Diclegis. 

## 2018-12-20 NOTE — Telephone Encounter (Signed)
Pt is calling to request rx for nausea.  States nausea has really been bad the last two weeks day and night.  [phone]

## 2019-01-13 ENCOUNTER — Ambulatory Visit (INDEPENDENT_AMBULATORY_CARE_PROVIDER_SITE_OTHER): Payer: 59 | Admitting: Maternal Newborn

## 2019-01-13 ENCOUNTER — Encounter: Payer: Self-pay | Admitting: Maternal Newborn

## 2019-01-13 ENCOUNTER — Encounter: Payer: 59 | Admitting: Obstetrics and Gynecology

## 2019-01-13 ENCOUNTER — Other Ambulatory Visit: Payer: Self-pay

## 2019-01-13 DIAGNOSIS — Z3482 Encounter for supervision of other normal pregnancy, second trimester: Secondary | ICD-10-CM

## 2019-01-13 DIAGNOSIS — Z3A12 12 weeks gestation of pregnancy: Secondary | ICD-10-CM

## 2019-01-13 DIAGNOSIS — Z3490 Encounter for supervision of normal pregnancy, unspecified, unspecified trimester: Secondary | ICD-10-CM

## 2019-01-13 NOTE — Progress Notes (Signed)
Pt gave address & DOB to verify identity. No complaints.

## 2019-01-13 NOTE — Progress Notes (Signed)
Virtual Visit via Telephone Note  I connected with Alexis LankSavannah B Kolbe on 01/17/19 at 10:05 AM EDT by telephone and verified that I am speaking with the correct person using two identifiers. I was located at the office. The patient was located at her home.   I discussed the limitations, risks, security and privacy concerns of performing an evaluation and management service by telephone and the availability of in person appointments. The patient expressed understanding and agreed to proceed.  See Prenatal Care Visit note below for details of the content of this visit.   I discussed the assessment and treatment plan with the patient. The patient was provided an opportunity to ask questions and all were answered. The patient agreed with the plan and demonstrated an understanding of the instructions.   The patient was advised to call back or seek an in-person evaluation if the symptoms worsen or if the condition fails to improve as anticipated.  I provided 8 minutes of non-face-to-face time during this encounter.   Oswaldo ConroyJacelyn Y Margaret Staggs, CNM   Prenatal Care Visit  Subjective  Alexis Ward is a 23 y.o. G2P1001 at 6266w1d being seen today for ongoing prenatal care.  She is currently monitored for the following issues for this low-risk pregnancy and has Supervision of low-risk pregnancy, unspecified trimester on their problem list.  ----------------------------------------------------------------------------------- Patient reports that she continues to have nausea; vomiting about every other day. She can usually eat a regular meal at lunch time. Is able to tolerate small amounts of food/snacks at other times of day.  No vaginal bleeding. ----------------------------------------------------------------------------------- The following portions of the patient's history were reviewed and updated as appropriate: allergies, current medications, past family history, past medical history, past social  history, past surgical history and problem list. Problem list updated.  Objective  Last menstrual period 11/01/2018. Pregravid weight 175 lb (79.4 kg) Total Weight Gain -1 lb (-0.454 kg)  Physical exam not performed due to telephone visit.  Assessment   23 y.o. G2P1001 at 4966w1d EDD 07/27/2019, by Ultrasound presenting for a prenatal telephone visit.  Plan   pregnancy2 Problems (from 10/26/18 to present)    Problem Noted Resolved   Supervision of low-risk pregnancy, unspecified trimester 12/16/2018 by Oswaldo ConroySchmid, Alanta Scobey Y, CNM No   Overview Addendum 12/16/2018  4:26 PM by Oswaldo ConroySchmid, Kahiau Schewe Y, CNM    Clinic [facility] Prenatal Labs  Dating Ultrasound at 8w 1d Blood type: A/Positive/-- (03/12 1022)   Genetic Screen Declines Antibody:Negative (03/12 1022)  Anatomic US  Rubella: 4.14 (03/12 1022) Varicella: Non-immune  GTT Early:               Third trimester:  RPR: Non Reactive (03/12 1022)   Rhogam N/A HBsAg: Negative (03/12 1022)   TDaP vaccine                       Flu Shot: HIV: Non Reactive (03/12 1022)   Baby Food Formula                           GBS:   Contraception  Pap: 01/03/2018, NILM  CBB     CS/VBAC    Support Person               Discussed taking additional doses of Diclegis to help with nausea/vomiting. Advised her to contact us if the additional doses do not help relieve symptoms so that we can consider alternative or additional medication.  Please refer to After Visit Summary for other counseling recommendations.   Return in about 4 weeks (around 02/10/2019) for ROB/phone.  Marcelyn Bruins, CNM 01/13/2019

## 2019-01-17 NOTE — Patient Instructions (Signed)
Hello,  Given the current COVID-19 pandemic, our practice is making changes in how we are providing care to our patients. We are limiting in-person visits for the safety of all of our patients.   As a practice, we have met to discuss the best way to minimize visits, but still provide excellent care to our expecting mothers.  We have decided on the following visit structure for low-risk pregnancies.  Initial Pregnancy visit will be conducted as a telephone or web visit.  Between 10-14 weeks  there will be one in-person visit for an ultrasound, lab work, and genetic screening. 20 weeks in-person visit with an anatomy ultrasound  28 weeks in-person office visit for a 1-hour glucose test and a TDAP vaccination 32 weeks in-person office visit 34 weeks telephone visit 36 weeks in-person office visit for GBS, chlamydia, and gonorrhea testing 38 weeks in-person office visit 40 weeks in-person office visit  Understandably, some patients will require more visits than what is outlined above. Additional visits will be determined on a case-by-case basis.   We will, as always, be available for emergencies or to address concerns that might arise between in-person visits. We ask that you allow [place] the opportunity to address any concerns over the phone or through a virtual visit first. We will be available to return your phone calls throughout the day.   If you are able to purchase a scale, a blood pressure machine, and a home fetal doppler visits could be limited further. This will help decrease your exposure risks, but these purchases are not a necessity.   Things seem to change daily and there is the possibility that this structure could change, please be patient as we adapt to a new way of caring for patients.   Thank you for trusting [place] with your prenatal care. Our practice values you and looks forward to providing you with excellent care.   Sincerely,   [facility], [place]     COVID-19 and Your Pregnancy FAQ  How can I prevent infection with COVID-19 during my pregnancy? Social distancing is key. Please limit any interactions in public. Try and work from home if possible. Frequently wash your hands after touching possibly contaminated surfaces. Avoid touching your face.  Minimize trips to the store. Consider online ordering when possible.   Should I wear a mask? YES. It is recommended by the CDC that all people wear a cloth mask or facial covering in public. This will help reduce transmission as well as your risk or acquiring COVID-19. New studies are showing that even asymptomatic individuals can spread the virus from talking.   What are the symptoms of COVID-19? Fever (greater than 100.4 F), dry cough, shortness of breath.  Am I more at risk for COVID-19 since I am pregnant? There is not currently data showing that pregnant women are more adversely impacted by COVID-19 than the general population. However, we know that pregnant women tend to have worse respiratory complications from similar diseases such as the flu and SARS and for this reason should be considered an at-risk population.  What do I do if I am experiencing the symptoms of COVID-19? Testing is being limited because of test availability. If you are experiencing symptoms you should quarantine yourself, and the members of your family, for at least 2 weeks at home.   Please visit this website for more information: [email or web address]  When should I go to the Emergency Room? Please go to the emergency room if you  are experiencing ANY of these symptoms*:  1.    Difficulty breathing or shortness of breath 2.    Persistent pain or pressure in the chest 3.    Confusion or difficulty being aroused (or awakened) 4.    Bluish lips or face  *This list is not all inclusive. Please consult our office for any other symptoms that are severe or  concerning.  What do I do if I am having difficulty breathing? You should go to the Emergency Room for evaluation. At this time they have a tent set up for evaluating patients with COVID-19 symptoms.   How will my prenatal care be different because of the COVID-19 pandemic? It has been recommended to reduce the frequency of face-to-face visits and use resources such as telephone and virtual visits when possible. Using a scale, blood pressure machine and fetal doppler at home can further help reduce face-to-face visits. You will be provided with additional information on this topic.  We ask that you come to your visits alone to minimize potential exposures to  COVID-19.  How can I receive childbirth education? At this time in-person classes have been cancelled. You can register for online childbirth education, breastfeeding, and newborn care classes.  Please visit:  [email or web address] for more information  How will my hospital birth experience be different? The hospital is currently limiting visitors. This means that while you are in labor you can only have one person at the hospital with you. Additional family members will not be allowed to wait in the building or outside your room. Your one support person can be the father of the baby, a relative, a doula, or a friend. Once one support person is designated that person will wear a band. This band cannot be shared with multiple people.  Nitrous Gas is not being offered for pain relief since the tubing and filter for the machine can not be sanitized in a way to guarantee prevention of transmission of COVID-19.  Nasal cannula use of oxygen for fetal indications has also been discontinued.  Currently a clear plastic sheet is being hung between mom and the delivering provider during pushing and delivery to help prevent transmission of COVID-19.      How long will I stay in the hospital for after giving birth? It is also recommended that  discharge home be expedited during the COVID-19 outbreak. This means staying for 1 day after a vaginal delivery and 2 days after a cesarean section. Patients who need to stay longer for medical reasons are allowed to do so, but the goal will be for expedited discharge home.   What if I have COVID-19 and I am in labor? We ask that you wear a mask while on labor and delivery. We will try and accommodate you being placed in a room that is capable of filtering the air. Please call ahead if you are in labor and on your way to the hospital. The phone number for labor and delivery at [facility] is [phone].  If I have COVID-19 when my baby is born how can I prevent my baby from contracting COVID-19? This is an issue that will have to be discussed on a case-by-case basis. Current recommendations suggest providing separate isolation rooms for both the mother and new infant as well as limiting visitors. However, there are practical challenges to this recommendation. The situation will assuredly change and decisions will be influenced by the desires of the mother and availability of space.    Some suggestions are the use of a curtain or physical barrier between mom and infant, hand hygiene, mom wearing a mask, or 6 feet of spacing between a mom and infant.   Can I breastfeed during the COVID-19 pandemic?   Yes, breastfeeding is encouraged.  Can I breastfeed if I have COVID-19? Yes. Covid-19 has not been found in breast milk. This means you cannot give COVID-19 to your child through breast milk. Breast feeding will also help pass antibodies to fight infection to your baby.   What precautions should I take when breastfeeding if I have COVID-19? If a mother and newborn do room-in and the mother wishes to feed at the breast, she should put on a facemask and practice hand hygiene before each feeding.  What precautions should I take when pumping if I have COVID-19? Prior to expressing  breast milk, mothers should practice hand hygiene. After each pumping session, all parts that come into contact with breast milk should be thoroughly washed and the entire pump should be appropriately disinfected per the manufacturer's instructions. This expressed breast milk should be fed to the newborn by a healthy caregiver.  What if I am pregnant and work in healthcare? Based on limited data regarding COVID-19 and pregnancy, ACOG currently does not propose creating additional restrictions on pregnant health care personnel because of COVID-19 alone. Pregnant women do not appear to be at higher risk of severe disease related to COVID-19. Pregnant health care personnel should follow CDC risk assessment and infection control guidelines for health care personnel exposed to patients with suspected or confirmed COVID-19. Adherence to recommended infection prevention and control practices is an important part of protecting all health care personnel in health care settings.    Information on COVID-19 in pregnancy is very limited; however, facilities may want to consider limiting exposure of pregnant health care personnel to patients with confirmed or suspected COVID-19 infection, especially during higher-risk procedures (eg, aerosol-generating procedures), if feasible, based on staffing availability.

## 2019-02-10 ENCOUNTER — Ambulatory Visit (INDEPENDENT_AMBULATORY_CARE_PROVIDER_SITE_OTHER): Payer: 59 | Admitting: Advanced Practice Midwife

## 2019-02-10 ENCOUNTER — Encounter: Payer: Self-pay | Admitting: Advanced Practice Midwife

## 2019-02-10 ENCOUNTER — Other Ambulatory Visit: Payer: Self-pay

## 2019-02-10 VITALS — Wt 170.0 lb

## 2019-02-10 DIAGNOSIS — Z3490 Encounter for supervision of normal pregnancy, unspecified, unspecified trimester: Secondary | ICD-10-CM

## 2019-02-10 DIAGNOSIS — Z3A16 16 weeks gestation of pregnancy: Secondary | ICD-10-CM

## 2019-02-10 DIAGNOSIS — Z3482 Encounter for supervision of other normal pregnancy, second trimester: Secondary | ICD-10-CM

## 2019-02-10 NOTE — Progress Notes (Signed)
Routine Prenatal Care Visit- Virtual Visit  Subjective   Virtual Visit via Telephone Note  I connected with Alexis Ward on 02/10/19 at  9:30 AM EDT by telephone and verified that I am speaking with the correct person using two identifiers.   I discussed the limitations, risks, security and privacy concerns of performing an evaluation and management service by telephone and the availability of in person appointments. I also discussed with the patient that there may be a patient responsible charge related to this service. The patient expressed understanding and agreed to proceed.  The patient was driving to work I spoke with the patient from my  Office phone The names of people involved in this encounter were: the patient Alexis Ward and myself Tresea Mall, CNM   Alexis Ward is a 23 y.o. G2P1001 at [redacted]w[redacted]d being seen today for ongoing prenatal care.  She is currently monitored for the following issues for this low-risk pregnancy and has Supervision of low-risk pregnancy, unspecified trimester on their problem list.  ----------------------------------------------------------------------------------- Patient reports continued nausea and some vomiting. She is able to tolerate small, frequent meals. She doesn't think she is gaining any weight yet.  Her anti-nausea medication helps some. She thinks she may be feeling the baby move.  . Vag. Bleeding: None.   . Denies leaking of fluid.  ----------------------------------------------------------------------------------- The following portions of the patient's history were reviewed and updated as appropriate: allergies, current medications, past family history, past medical history, past social history, past surgical history and problem list. Problem list updated.   Objective  Weight 170 lb (77.1 kg), last menstrual period 11/01/2018. Pregravid weight 175 lb (79.4 kg) Total Weight Gain -5 lb (-2.268 kg) Urinalysis:      Fetal  Status:   Physical Exam could not be performed. Because of the COVID-19 outbreak this visit was performed over the phone and not in person.   Assessment   23 y.o. G2P1001 at [redacted]w[redacted]d by  07/27/2019, by Ultrasound presenting for routine prenatal visit  Plan   pregnancy2 Problems (from 10/26/18 to present)    Problem Noted Resolved   Supervision of low-risk pregnancy, unspecified trimester 12/16/2018 by Oswaldo Conroy, CNM No   Overview Addendum 12/16/2018  4:26 PM by Oswaldo Conroy, CNM    [facility]inic [facility] Prenatal Labs  Dating Ultrasound at 8w 1d Blood type: A/Positive/-- (03/12 1022)   Genetic Screen Declines Antibody:Negative (03/12 1022)  Anatomic [place]  Rubella: 4.14 (03/12 1022) Varicella: Non-immune  GTT Early:               Third trimester:  RPR: Non Reactive (03/12 1022)   Rhogam N/A HBsAg: Negative (03/12 1022)   TDaP vaccine                       Flu Shot: HIV: Non Reactive (03/12 1022)   Baby Food Formula                           GBS:   Contraception  Pap: 01/03/2018, NILM  CBB     CS/VBAC    Support Person                  Gestational age appropriate obstetric precautions including but not limited to vaginal bleeding, contractions, leaking of fluid and fetal movement were reviewed in detail with the patient.     Follow Up Instructions: Small, frequent meals Adequate hydration  I discussed the assessment and treatment plan with the patient. The patient was provided an opportunity to ask questions and all were answered. The patient agreed with the plan and demonstrated an understanding of the instructions.   The patient was advised to call back or seek an in-person evaluation if the symptoms worsen or if the condition fails to improve as anticipated.  I provided 10 minutes of non-face-to-face time during this encounter.  Return in about 4 weeks (around 03/10/2019) for anatomy scan and ROB.  Parke PoissonJane E. Marisel Tostenson, CNM [facility]  02/10/2019, 9:58 AM

## 2019-02-10 NOTE — Progress Notes (Signed)
ROB televisit C/O Losing weight

## 2019-02-21 ENCOUNTER — Telehealth: Payer: Self-pay

## 2019-02-21 NOTE — Telephone Encounter (Signed)
Pt has apt for Anatomy scan 03/10/2019. Her husband works out of town a lot. She isn't sure he will be able to make it home for that apt. She is inquiring if she would be permitted to do a video chat when they found out the gender at that apt. [phone]

## 2019-02-25 NOTE — Telephone Encounter (Signed)
Pt notified ok to do face time/video call as long as no one is recording during the call per [employer].

## 2019-03-06 ENCOUNTER — Telehealth: Payer: Self-pay

## 2019-03-06 NOTE — Telephone Encounter (Signed)
Pt called triage, wanting to know how many people can come in for u/s and can she video call , pt aware 1 person and no video calls

## 2019-03-10 ENCOUNTER — Encounter: Payer: Self-pay | Admitting: Maternal Newborn

## 2019-03-10 ENCOUNTER — Ambulatory Visit (INDEPENDENT_AMBULATORY_CARE_PROVIDER_SITE_OTHER): Payer: 59 | Admitting: Maternal Newborn

## 2019-03-10 ENCOUNTER — Ambulatory Visit (INDEPENDENT_AMBULATORY_CARE_PROVIDER_SITE_OTHER): Payer: 59

## 2019-03-10 ENCOUNTER — Other Ambulatory Visit: Payer: Self-pay

## 2019-03-10 VITALS — BP 124/60 | Wt 169.0 lb

## 2019-03-10 DIAGNOSIS — Z363 Encounter for antenatal screening for malformations: Secondary | ICD-10-CM | POA: Diagnosis not present

## 2019-03-10 DIAGNOSIS — Z3A20 20 weeks gestation of pregnancy: Secondary | ICD-10-CM

## 2019-03-10 DIAGNOSIS — Z3490 Encounter for supervision of normal pregnancy, unspecified, unspecified trimester: Secondary | ICD-10-CM

## 2019-03-10 DIAGNOSIS — Z3482 Encounter for supervision of other normal pregnancy, second trimester: Secondary | ICD-10-CM

## 2019-03-10 NOTE — Progress Notes (Signed)
ROB  °Anatomy scan °

## 2019-03-10 NOTE — Progress Notes (Signed)
    Routine Prenatal Care Visit  Subjective  Alexis Ward is a 23 y.o. G2P1001 at [redacted]w[redacted]d being seen today for ongoing prenatal care.  She is currently monitored for the following issues for this low-risk pregnancy and has Supervision of low-risk pregnancy, unspecified trimester on their problem list.  ----------------------------------------------------------------------------------- Patient reports that nausea has improved a lot and usually stays under control with eating small and frequent meals. She has not been vomiting.  Contractions: Not present. Vag. Bleeding: None.  Movement: Present. No leaking of fluid.  ----------------------------------------------------------------------------------- The following portions of the patient's history were reviewed and updated as appropriate: allergies, current medications, past family history, past medical history, past social history, past surgical history and problem list. Problem list updated.  Objective  Blood pressure 124/60, weight 169 lb (76.7 kg), last menstrual period 11/01/2018. Pregravid weight 175 lb (79.4 kg) Total Weight Gain -6 lb (-2.722 kg)  Fetal Status: Fetal Heart Rate (bpm): 159   Movement: Present     General:  Alert, oriented and cooperative. Patient is in no acute distress.  Skin: Skin is warm and dry. No rash noted.   Cardiovascular: Normal heart rate noted  Respiratory: Normal respiratory effort, no problems with respiration noted  Abdomen: Soft, gravid, appropriate for gestational age. Pain/Pressure: Absent     Pelvic:  Cervical exam deferred        Extremities: Normal range of motion.  Edema: None  Mental Status: Normal mood and affect. Normal behavior. Normal judgment and thought content.     Assessment   23 y.o. G2P1001 at [redacted]w[redacted]d, EDD 07/27/2019 by Ultrasound presenting for a routine prenatal visit.  Plan   pregnancy2 Problems (from 10/26/18 to present)    Problem Noted Resolved   Supervision of low-risk  pregnancy, unspecified trimester 12/16/2018 by Rexene Agent, CNM No   Overview Addendum 12/16/2018  4:26 PM by Rexene Agent, [facility] Prenatal Labs  Dating Ultrasound at 8w 1d Blood type: A/Positive/-- (03/12 1022)   Genetic Screen Declines Antibody:Negative (03/12 1022)  Anatomic [place]  Rubella: 4.14 (03/12 1022) Varicella: Non-immune  GTT Early:               Third trimester:  RPR: Non Reactive (03/12 1022)   Rhogam N/A HBsAg: Negative (03/12 1022)   TDaP vaccine                       Flu Shot: HIV: Non Reactive (03/12 1022)   Baby Food Formula                           GBS:   Contraception  Pap: 01/03/2018, NILM  CBB     CS/VBAC    Support Person               Anatomy scan complete and normal today.   Please refer to After Visit Summary for other counseling recommendations.   Return in about 4 weeks (around 04/07/2019) for ROB.  Avel Sensor, CNM 03/10/2019  1:35 PM

## 2019-03-10 NOTE — Patient Instructions (Signed)

## 2019-03-24 ENCOUNTER — Telehealth: Payer: Self-pay

## 2019-03-24 NOTE — Telephone Encounter (Signed)
Pt calling after hour nurse 03/22/19 at 10:24am c/o 22 wks; low back pain/cramps since the night before. No vag bleeding or drainage. No fever, no other sxs.  Was adv to go to [facility].  Called pt to f/u but vm full.  [phone]

## 2019-03-26 NOTE — Telephone Encounter (Signed)
Pt states she did not go to [facility] b/c pain/cramps did not get worse; they actually went away that night.  Pt doing well today.

## 2019-04-07 ENCOUNTER — Other Ambulatory Visit: Payer: Self-pay

## 2019-04-07 ENCOUNTER — Ambulatory Visit (INDEPENDENT_AMBULATORY_CARE_PROVIDER_SITE_OTHER): Payer: 59 | Admitting: Obstetrics and Gynecology

## 2019-04-07 DIAGNOSIS — Z3A24 24 weeks gestation of pregnancy: Secondary | ICD-10-CM

## 2019-04-07 DIAGNOSIS — Z3482 Encounter for supervision of other normal pregnancy, second trimester: Secondary | ICD-10-CM

## 2019-04-07 DIAGNOSIS — Z3490 Encounter for supervision of normal pregnancy, unspecified, unspecified trimester: Secondary | ICD-10-CM

## 2019-04-07 NOTE — Progress Notes (Signed)
   I connected with Alexis Ward on 04/07/19 at 10:50 AM EDT by telephone and verified that I am speaking with the correct person using two identifiers.   I discussed the limitations, risks, security and privacy concerns of performing an evaluation and management service by telephone and the availability of in person appointments. I also discussed with the patient that there may be a patient responsible charge related to this service. The patient expressed understanding and agreed to proceed.  The patient was at home I spoke with the patient from my workstation phone.  The names of people involved in this encounter were: Alexis Ward , and [name]   Routine Prenatal Care Visit  Subjective  Alexis Ward is a 23 y.o. G2P1001 at [redacted]w[redacted]d being seen today for ongoing prenatal care.  She is currently monitored for the following issues for this low-risk pregnancy and has Supervision of low-risk pregnancy, unspecified trimester on their problem list.  ----------------------------------------------------------------------------------- Patient reports no complaints.   Contractions: Not present. Vag. Bleeding: None.  Movement: Present. Denies leaking of fluid.  ----------------------------------------------------------------------------------- The following portions of the patient's history were reviewed and updated as appropriate: allergies, current medications, past family history, past medical history, past social history, past surgical history and problem list. Problem list updated.   Objective  Last menstrual period 11/01/2018. Pregravid weight 175 lb (79.4 kg) Total Weight Gain -6 lb (-2.722 kg) Urinalysis:      Fetal Status:     Movement: Present     No physical exam as this was a remote telephone visit to promote social distancing during the current COVID-19 Pandemic   Assessment   23 y.o. G2P1001 at [redacted]w[redacted]d by  07/27/2019, by Ultrasound presenting for routine prenatal  visit  Plan   pregnancy2 Problems (from 10/26/18 to present)    Problem Noted Resolved   Supervision of low-risk pregnancy, unspecified trimester 12/16/2018 by Rexene Agent, CNM No   Overview Addendum 03/10/2019  1:34 PM by Rexene Agent, [place] Prenatal Labs  Dating Ultrasound at 8w 1d Blood type: A/Positive/-- (03/12 1022)   Genetic Screen Declines Antibody:Negative (03/12 1022)  Anatomic [place] Complete 03/10/2019 Rubella: 4.14 (03/12 1022) Varicella: Non-immune  GTT Early: N/A           Third trimester:  RPR: Non Reactive (03/12 1022)   Rhogam N/A HBsAg: Negative (03/12 1022)   TDaP vaccine                       Flu Shot: HIV: Non Reactive (03/12 1022)   Baby Food Formula                           GBS:   Contraception  Pap: 01/03/2018, NILM  CBB     CS/VBAC    Support Person                  Gestational age appropriate obstetric precautions including but not limited to vaginal bleeding, contractions, leaking of fluid and fetal movement were reviewed in detail with the patient.    Return in about 4 weeks (around 05/05/2019) for ROB and 28 week labs.  [name], MD, [facility], [facility] 04/07/2019, 11:45 AM

## 2019-04-07 NOTE — Progress Notes (Signed)
ROB televisit No concerns  

## 2019-05-05 ENCOUNTER — Ambulatory Visit (INDEPENDENT_AMBULATORY_CARE_PROVIDER_SITE_OTHER): Payer: 59 | Admitting: Certified Nurse Midwife

## 2019-05-05 ENCOUNTER — Other Ambulatory Visit: Payer: 59

## 2019-05-05 ENCOUNTER — Other Ambulatory Visit: Payer: Self-pay

## 2019-05-05 VITALS — BP 100/60 | Wt 172.8 lb

## 2019-05-05 DIAGNOSIS — Z3493 Encounter for supervision of normal pregnancy, unspecified, third trimester: Secondary | ICD-10-CM

## 2019-05-05 DIAGNOSIS — Z3A28 28 weeks gestation of pregnancy: Secondary | ICD-10-CM

## 2019-05-05 DIAGNOSIS — Z3A24 24 weeks gestation of pregnancy: Secondary | ICD-10-CM

## 2019-05-05 DIAGNOSIS — Z3490 Encounter for supervision of normal pregnancy, unspecified, unspecified trimester: Secondary | ICD-10-CM

## 2019-05-05 LAB — POCT URINALYSIS DIPSTICK OB
Glucose, UA: NEGATIVE
POC,PROTEIN,UA: NEGATIVE

## 2019-05-05 NOTE — Progress Notes (Signed)
No problems.rj 

## 2019-05-05 NOTE — Progress Notes (Signed)
ROB at 28 weeks 1 day: Doing well. Has some discomfort in lower abdomen when first getting up in the morning, but once up, the discomfort goes away. Baby active. No vaginal bleeding or leakage of water. 28 week labs today. A POS blood type FH 29 and FHTs 150 A: IUP at 28 weeks with S=D P: ROB in 2 weeks.  Dalia Heading, CNM

## 2019-05-06 LAB — 28 WEEK RH+PANEL
Gestational Diabetes Screen: 76 mg/dL (ref 65–139)
HIV Screen 4th Generation wRfx: NONREACTIVE
RPR Ser Ql: NONREACTIVE

## 2019-05-19 ENCOUNTER — Ambulatory Visit (INDEPENDENT_AMBULATORY_CARE_PROVIDER_SITE_OTHER): Payer: 59 | Admitting: Certified Nurse Midwife

## 2019-05-19 ENCOUNTER — Other Ambulatory Visit: Payer: Self-pay

## 2019-05-19 VITALS — BP 110/70 | Wt 172.2 lb

## 2019-05-19 DIAGNOSIS — Z23 Encounter for immunization: Secondary | ICD-10-CM | POA: Diagnosis not present

## 2019-05-19 DIAGNOSIS — Z3A30 30 weeks gestation of pregnancy: Secondary | ICD-10-CM

## 2019-05-19 DIAGNOSIS — O9989 Other specified diseases and conditions complicating pregnancy, childbirth and the puerperium: Secondary | ICD-10-CM

## 2019-05-19 DIAGNOSIS — Z130 Encounter for screening for diseases of the blood and blood-forming organs and certain disorders involving the immune mechanism: Secondary | ICD-10-CM

## 2019-05-19 DIAGNOSIS — Z3490 Encounter for supervision of normal pregnancy, unspecified, unspecified trimester: Secondary | ICD-10-CM

## 2019-05-19 DIAGNOSIS — O99891 Other specified diseases and conditions complicating pregnancy: Secondary | ICD-10-CM

## 2019-05-19 DIAGNOSIS — M549 Dorsalgia, unspecified: Secondary | ICD-10-CM

## 2019-05-19 LAB — POCT URINALYSIS DIPSTICK
Bilirubin, UA: NEGATIVE
Blood, UA: NEGATIVE
Glucose, UA: NEGATIVE
Ketones, UA: NEGATIVE
Nitrite, UA: NEGATIVE
Protein, UA: NEGATIVE
Spec Grav, UA: 1.015 (ref 1.010–1.025)
Urobilinogen, UA: NEGATIVE E.U./dL — AB
pH, UA: 6 (ref 5.0–8.0)

## 2019-05-19 LAB — POCT URINALYSIS DIPSTICK OB
Glucose, UA: NEGATIVE
POC,PROTEIN,UA: NEGATIVE

## 2019-05-19 NOTE — Progress Notes (Signed)
No problems.rj 

## 2019-05-20 LAB — CBC WITH DIFFERENTIAL/PLATELET
Basophils Absolute: 0 10*3/uL (ref 0.0–0.2)
Basos: 0 %
EOS (ABSOLUTE): 0.1 10*3/uL (ref 0.0–0.4)
Eos: 1 %
Hematocrit: 36.3 % (ref 34.0–46.6)
Hemoglobin: 12.4 g/dL (ref 11.1–15.9)
Immature Grans (Abs): 0.1 10*3/uL (ref 0.0–0.1)
Immature Granulocytes: 1 %
Lymphocytes Absolute: 1.6 10*3/uL (ref 0.7–3.1)
Lymphs: 17 %
MCH: 30 pg (ref 26.6–33.0)
MCHC: 34.2 g/dL (ref 31.5–35.7)
MCV: 88 fL (ref 79–97)
Monocytes Absolute: 0.5 10*3/uL (ref 0.1–0.9)
Monocytes: 5 %
Neutrophils Absolute: 7.4 10*3/uL — ABNORMAL HIGH (ref 1.4–7.0)
Neutrophils: 76 %
Platelets: 184 10*3/uL (ref 150–450)
RBC: 4.13 x10E6/uL (ref 3.77–5.28)
RDW: 12.1 % (ref 11.7–15.4)
WBC: 9.6 10*3/uL (ref 3.4–10.8)

## 2019-05-21 LAB — URINE CULTURE

## 2019-05-24 NOTE — Progress Notes (Signed)
Alexis Ward at 30wk1d: Complains of some left  lower back pain.. No dysuria or hematuria. Has a history of kidney stones. Small leukocytes on dipstick. Urine culture sent.  Alexis active. Plans to bottle feed Alexis Ward CBC done (not enough blood drawn at time of 28 week labs) TDAP today and BT consent Alexis Ward in 2 weeks  Dalia Heading, CNM

## 2019-06-03 ENCOUNTER — Ambulatory Visit (INDEPENDENT_AMBULATORY_CARE_PROVIDER_SITE_OTHER): Payer: 59 | Admitting: Advanced Practice Midwife

## 2019-06-03 ENCOUNTER — Other Ambulatory Visit: Payer: Self-pay

## 2019-06-03 ENCOUNTER — Encounter: Payer: Self-pay | Admitting: Advanced Practice Midwife

## 2019-06-03 VITALS — BP 110/60 | Wt 173.0 lb

## 2019-06-03 DIAGNOSIS — Z3A32 32 weeks gestation of pregnancy: Secondary | ICD-10-CM

## 2019-06-03 DIAGNOSIS — Z23 Encounter for immunization: Secondary | ICD-10-CM

## 2019-06-03 DIAGNOSIS — Z3483 Encounter for supervision of other normal pregnancy, third trimester: Secondary | ICD-10-CM

## 2019-06-03 LAB — POCT URINALYSIS DIPSTICK OB
Glucose, UA: NEGATIVE
POC,PROTEIN,UA: NEGATIVE

## 2019-06-03 NOTE — Patient Instructions (Signed)
Third Trimester of Pregnancy The third trimester is from week 28 through week 40 (months 7 through 9). The third trimester is a time when the unborn baby (fetus) is growing rapidly. At the end of the ninth month, the fetus is about 20 inches in length and weighs 6-10 pounds. Body changes during your third trimester Your body will continue to go through many changes during pregnancy. The changes vary from woman to woman. During the third trimester:  Your weight will continue to increase. You can expect to gain 25-35 pounds (11-16 kg) by the end of the pregnancy.  You may begin to get stretch marks on your hips, abdomen, and breasts.  You may urinate more often because the fetus is moving lower into your pelvis and pressing on your bladder.  You may develop or continue to have heartburn. This is caused by increased hormones that slow down muscles in the digestive tract.  You may develop or continue to have constipation because increased hormones slow digestion and cause the muscles that push waste through your intestines to relax.  You may develop hemorrhoids. These are swollen veins (varicose veins) in the rectum that can itch or be painful.  You may develop swollen, bulging veins (varicose veins) in your legs.  You may have increased body aches in the pelvis, back, or thighs. This is due to weight gain and increased hormones that are relaxing your joints.  You may have changes in your hair. These can include thickening of your hair, rapid growth, and changes in texture. Some women also have hair loss during or after pregnancy, or hair that feels dry or thin. Your hair will most likely return to normal after your baby is born.  Your breasts will continue to grow and they will continue to become tender. A yellow fluid (colostrum) may leak from your breasts. This is the first milk you are producing for your baby.  Your belly button may stick out.  You may notice more swelling in your hands,  face, or ankles.  You may have increased tingling or numbness in your hands, arms, and legs. The skin on your belly may also feel numb.  You may feel short of breath because of your expanding uterus.  You may have more problems sleeping. This can be caused by the size of your belly, increased need to urinate, and an increase in your body's metabolism.  You may notice the fetus "dropping," or moving lower in your abdomen (lightening).  You may have increased vaginal discharge.  You may notice your joints feel loose and you may have pain around your pelvic bone. What to expect at prenatal visits You will have prenatal exams every 2 weeks until week 36. Then you will have weekly prenatal exams. During a routine prenatal visit:  You will be weighed to make sure you and the baby are growing normally.  Your blood pressure will be taken.  Your abdomen will be measured to track your baby's growth.  The fetal heartbeat will be listened to.  Any test results from the previous visit will be discussed.  You may have a cervical check near your due date to see if your cervix has softened or thinned (effaced).  You will be tested for Group B streptococcus. This happens between 35 and 37 weeks. Your health care provider may ask you:  What your birth plan is.  How you are feeling.  If you are feeling the baby move.  If you have had any abnormal   symptoms, such as leaking fluid, bleeding, severe headaches, or abdominal cramping.  If you are using any tobacco products, including cigarettes, chewing tobacco, and electronic cigarettes.  If you have any questions. Other tests or screenings that may be performed during your third trimester include:  Blood tests that check for low iron levels (anemia).  Fetal testing to check the health, activity level, and growth of the fetus. Testing is done if you have certain medical conditions or if there are problems during the pregnancy.  Nonstress test  (NST). This test checks the health of your baby to make sure there are no signs of problems, such as the baby not getting enough oxygen. During this test, a belt is placed around your belly. The baby is made to move, and its heart rate is monitored during movement. What is false labor? False labor is a condition in which you feel small, irregular tightenings of the muscles in the womb (contractions) that usually go away with rest, changing position, or drinking water. These are called Braxton Hicks contractions. Contractions may last for hours, days, or even weeks before true labor sets in. If contractions come at regular intervals, become more frequent, increase in intensity, or become painful, you should see your health care provider. What are the signs of labor?  Abdominal cramps.  Regular contractions that start at 10 minutes apart and become stronger and more frequent with time.  Contractions that start on the top of the uterus and spread down to the lower abdomen and back.  Increased pelvic pressure and dull back pain.  A watery or bloody mucus discharge that comes from the vagina.  Leaking of amniotic fluid. This is also known as your "water breaking." It could be a slow trickle or a gush. Let your health care provider know if it has a color or strange odor. If you have any of these signs, call your health care provider right away, even if it is before your due date. Follow these instructions at home: Medicines  Follow your health care provider's instructions regarding medicine use. Specific medicines may be either safe or unsafe to take during pregnancy.  Take a prenatal vitamin that contains at least 600 micrograms (mcg) of folic acid.  If you develop constipation, try taking a stool softener if your health care provider approves. Eating and drinking   Eat a balanced diet that includes fresh fruits and vegetables, whole grains, good sources of protein such as meat, eggs, or tofu,  and low-fat dairy. Your health care provider will help you determine the amount of weight gain that is right for you.  Avoid raw meat and uncooked cheese. These carry germs that can cause birth defects in the baby.  If you have low calcium intake from food, talk to your health care provider about whether you should take a daily calcium supplement.  Eat four or five small meals rather than three large meals a day.  Limit foods that are high in fat and processed sugars, such as fried and sweet foods.  To prevent constipation: ? Drink enough fluid to keep your urine clear or pale yellow. ? Eat foods that are high in fiber, such as fresh fruits and vegetables, whole grains, and beans. Activity  Exercise only as directed by your health care provider. Most women can continue their usual exercise routine during pregnancy. Try to exercise for 30 minutes at least 5 days a week. Stop exercising if you experience uterine contractions.  Avoid heavy lifting.  Do   not exercise in extreme heat or humidity, or at high altitudes.  Wear low-heel, comfortable shoes.  Practice good posture.  You may continue to have sex unless your health care provider tells you otherwise. Relieving pain and discomfort  Take frequent breaks and rest with your legs elevated if you have leg cramps or low back pain.  Take warm sitz baths to soothe any pain or discomfort caused by hemorrhoids. Use hemorrhoid cream if your health care provider approves.  Wear a good support bra to prevent discomfort from breast tenderness.  If you develop varicose veins: ? Wear support pantyhose or compression stockings as told by your healthcare provider. ? Elevate your feet for 15 minutes, 3-4 times a day. Prenatal care  Write down your questions. Take them to your prenatal visits.  Keep all your prenatal visits as told by your health care provider. This is important. Safety  Wear your seat belt at all times when driving.  Make  a list of emergency phone numbers, including numbers for family, friends, the hospital, and police and fire departments. General instructions  Avoid cat litter boxes and soil used by cats. These carry germs that can cause birth defects in the baby. If you have a cat, ask someone to clean the litter box for you.  Do not travel far distances unless it is absolutely necessary and only with the approval of your health care provider.  Do not use hot tubs, steam rooms, or saunas.  Do not drink alcohol.  Do not use any products that contain nicotine or tobacco, such as cigarettes and e-cigarettes. If you need help quitting, ask your health care provider.  Do not use any medicinal herbs or unprescribed drugs. These chemicals affect the formation and growth of the baby.  Do not douche or use tampons or scented sanitary pads.  Do not cross your legs for long periods of time.  To prepare for the arrival of your baby: ? Take prenatal classes to understand, practice, and ask questions about labor and delivery. ? Make a trial run to the hospital. ? Visit the hospital and tour the maternity area. ? Arrange for maternity or paternity leave through employers. ? Arrange for family and friends to take care of pets while you are in the hospital. ? Purchase a rear-facing car seat and make sure you know how to install it in your car. ? Pack your hospital bag. ? Prepare the baby's nursery. Make sure to remove all pillows and stuffed animals from the baby's crib to prevent suffocation.  Visit your dentist if you have not gone during your pregnancy. Use a soft toothbrush to brush your teeth and be gentle when you floss. Contact a health care provider if:  You are unsure if you are in labor or if your water has broken.  You become dizzy.  You have mild pelvic cramps, pelvic pressure, or nagging pain in your abdominal area.  You have lower back pain.  You have persistent nausea, vomiting, or diarrhea.   You have an unusual or bad smelling vaginal discharge.  You have pain when you urinate. Get help right away if:  Your water breaks before 37 weeks.  You have regular contractions less than 5 minutes apart before 37 weeks.  You have a fever.  You are leaking fluid from your vagina.  You have spotting or bleeding from your vagina.  You have severe abdominal pain or cramping.  You have rapid weight loss or weight gain.  You have   shortness of breath with chest pain.  You notice sudden or extreme swelling of your face, hands, ankles, feet, or legs.  Your baby makes fewer than 10 movements in 2 hours.  You have severe headaches that do not go away when you take medicine.  You have vision changes. Summary  The third trimester is from week 28 through week 40, months 7 through 9. The third trimester is a time when the unborn baby (fetus) is growing rapidly.  During the third trimester, your discomfort may increase as you and your baby continue to gain weight. You may have abdominal, leg, and back pain, sleeping problems, and an increased need to urinate.  During the third trimester your breasts will keep growing and they will continue to become tender. A yellow fluid (colostrum) may leak from your breasts. This is the first milk you are producing for your baby.  False labor is a condition in which you feel small, irregular tightenings of the muscles in the womb (contractions) that eventually go away. These are called Braxton Hicks contractions. Contractions may last for hours, days, or even weeks before true labor sets in.  Signs of labor can include: abdominal cramps; regular contractions that start at 10 minutes apart and become stronger and more frequent with time; watery or bloody mucus discharge that comes from the vagina; increased pelvic pressure and dull back pain; and leaking of amniotic fluid. This information is not intended to replace advice given to you by your health  care provider. Make sure you discuss any questions you have with your health care provider. Document Released: 09/05/2001 Document Revised: 01/02/2019 Document Reviewed: 10/17/2016 Elsevier Patient Education  2020 [employer]  

## 2019-06-03 NOTE — Addendum Note (Signed)
Addended by: Quintella Baton D on: 06/03/2019 10:53 AM   Modules accepted: Orders

## 2019-06-03 NOTE — Progress Notes (Signed)
  Routine Prenatal Care Visit  Subjective  Alexis Ward is a 23 y.o. G2P1001 at [redacted]w[redacted]d being seen today for ongoing prenatal care.  She is currently monitored for the following issues for this low-risk pregnancy and has Supervision of low-risk pregnancy, unspecified trimester on their problem list.  ----------------------------------------------------------------------------------- Patient reports still having occasional low back pain on right.   Contractions: Not present. Vag. Bleeding: None.  Movement: Present. Leaking Fluid denies.  ----------------------------------------------------------------------------------- The following portions of the patient's history were reviewed and updated as appropriate: allergies, current medications, past family history, past medical history, past social history, past surgical history and problem list. Problem list updated.  Objective  Blood pressure 110/60, weight 173 lb (78.5 kg), last menstrual period 11/01/2018. Pregravid weight 175 lb (79.4 kg) Total Weight Gain -2 lb (-0.907 kg) Urinalysis: Urine Protein    Urine Glucose    Fetal Status: Fetal Heart Rate (bpm): 154 Fundal Height: 32 cm Movement: Present     General:  Alert, oriented and cooperative. Patient is in no acute distress.  Skin: Skin is warm and dry. No rash noted.   Cardiovascular: Normal heart rate noted  Respiratory: Normal respiratory effort, no problems with respiration noted  Abdomen: Soft, gravid, appropriate for gestational age. Pain/Pressure: Absent     Pelvic:  Cervical exam deferred        Extremities: Normal range of motion.  Edema: None  Mental Status: Normal mood and affect. Normal behavior. Normal judgment and thought content.   Assessment   23 y.o. G2P1001 at [redacted]w[redacted]d by  07/27/2019, by Ultrasound presenting for routine prenatal visit  Plan   pregnancy2 Problems (from 10/26/18 to present)    Problem Noted Resolved   Supervision of low-risk pregnancy, unspecified  trimester 12/16/2018 by Rexene Agent, CNM No   Overview Addendum 05/06/2019 11:26 AM by [name], MD    [facility] Prenatal Labs  Dating Ultrasound at 8w 1d Blood type: A/Positive/-- (03/12 1022)   Genetic Screen Declines Antibody:Negative (03/12 1022)  Anatomic [place] Complete 03/10/2019 Rubella: 4.14 (03/12 1022) Varicella: Non-immune  GTT Early: N/A           Third trimester: 75 RPR: Non Reactive (03/12 1022)   Rhogam N/A HBsAg: Negative (03/12 1022)   TDaP vaccine                       Flu Shot: HIV: Non Reactive (03/12 1022)   Baby Food Formula                           GBS:   Contraception  Pap: 01/03/2018, NILM  CBB     CS/VBAC    Support Person                  Preterm labor symptoms and general obstetric precautions including but not limited to vaginal bleeding, contractions, leaking of fluid and fetal movement were reviewed in detail with the patient. Please refer to After Visit Summary for other counseling recommendations.  Back pain: abdominal support band, heat/ice, stretches/exercise, epsom salt bath  Return in about 2 weeks (around 06/17/2019) for rob.  Rod Can, CNM 06/03/2019 10:49 AM

## 2019-06-16 ENCOUNTER — Telehealth: Payer: Self-pay

## 2019-06-16 ENCOUNTER — Other Ambulatory Visit: Payer: Self-pay

## 2019-06-16 ENCOUNTER — Observation Stay
Admission: EM | Admit: 2019-06-16 | Discharge: 2019-06-16 | Disposition: A | Discharge status: Home / Self Care | Payer: 59 | Attending: Obstetrics & Gynecology | Admitting: Obstetrics & Gynecology

## 2019-06-16 DIAGNOSIS — Z3483 Encounter for supervision of other normal pregnancy, third trimester: Secondary | ICD-10-CM | POA: Diagnosis present

## 2019-06-16 DIAGNOSIS — Z3A34 34 weeks gestation of pregnancy: Secondary | ICD-10-CM | POA: Diagnosis not present

## 2019-06-16 DIAGNOSIS — O4703 False labor before 37 completed weeks of gestation, third trimester: Secondary | ICD-10-CM

## 2019-06-16 DIAGNOSIS — Z3490 Encounter for supervision of normal pregnancy, unspecified, unspecified trimester: Secondary | ICD-10-CM

## 2019-06-16 LAB — RUPTURE OF MEMBRANE (ROM)PLUS: Rom Plus: NEGATIVE

## 2019-06-16 NOTE — Discharge Summary (Signed)
See Final Progress Note 06/16/2019. 

## 2019-06-16 NOTE — Final Progress Note (Signed)
Physician Final Progress Note  Patient ID: Alexis Ward MRN: [id number] DOB/AGE: [birth year] 23 y.o.  Admit date: 06/16/2019 Admitting provider: Nadara Mustard, MD Discharge date: 06/16/2019   Admission Diagnoses: Indication for care in labor and delivery, antepartum  Discharge Diagnoses: None  History of Present Illness: The patient is a 23 y.o. female G2P1001 at [redacted]w[redacted]d who presents for possible ROM. She was walking to the mailbox and she felt a gush of fluid that soaked her underwear. It was clear and odorless. She has not felt any more fluid coming out since this happened. She has not had any vaginal bleeding or contractions. She has felt good fetal movement.  Review of Systems: Review of systems negative unless otherwise noted in HPI.   Past Medical History:  Diagnosis Date  . Kidney stone   . Renal disorder     Past Surgical History:  Procedure Laterality Date  . NO PAST SURGERIES      No current facility-administered medications on file prior to encounter.    Current Outpatient Medications on File Prior to Encounter  Medication Sig Dispense Refill  . Prenatal Vit-Fe Fumarate-FA (MULTIVITAMIN-PRENATAL) 27-0.8 MG TABS tablet Take 1 tablet by mouth daily at 12 noon.    . Doxylamine-Pyridoxine (DICLEGIS) 10-10 MG TBEC Take 2 tablets by mouth at bedtime. If symptoms persist, add one tablet in the morning and one in the afternoon (Patient not taking: Reported on 05/19/2019) 100 tablet 5    No Known Allergies  Social History   Socioeconomic History  . Marital status: Married    Spouse name: Not on file  . Number of children: Not on file  . Years of education: Not on file  . Highest education level: Not on file  Occupational History  . Not on file  Social Needs  . Financial resource strain: Not on file  . Food insecurity    Worry: Not on file    Inability: Not on file  . Transportation needs    Medical: Not on file    Non-medical: Not on file  Tobacco Use  .  Smoking status: Never Smoker  . Smokeless tobacco: Never Used  Substance and Sexual Activity  . Alcohol use: No  . Drug use: No  . Sexual activity: Yes    Birth control/protection: Injection  Lifestyle  . Physical activity    Days per week: Not on file    Minutes per session: Not on file  . Stress: Not on file  Relationships  . Social Musician on phone: Not on file    Gets together: Not on file    Attends religious service: Not on file    Active member of club or organization: Not on file    Attends meetings of clubs or organizations: Not on file    Relationship status: Not on file  . Intimate partner violence    Fear of current or ex partner: Not on file    Emotionally abused: Not on file    Physically abused: Not on file    Forced sexual activity: Not on file  Other Topics Concern  . Not on file  Social History Narrative  . Not on file    Family history: Negative/unremarkable except as detailed in HPI. No family history of birth defects.   Physical Exam: BP 120/62 (BP Location: Left Arm)   Pulse 79   Temp 98.5 F (36.9 C) (Oral)   Resp 20   LMP 11/01/2018 (Approximate)   Gen:  NAD Pulm: No increased work of breathing Pelvic: deferred, RN reports no fluid on pad or at perineum when collecting sample for ROM Plus. Ext: No signs of DVT Psych: Mood appropriate, affect full  NST Baseline: 145 bpm Variability: moderate Accelerations: present Decelerations: absent Tocometry: no contractions The patient was monitored for 60+ minutes, fetal heart rate tracing was deemed reactive.  Significant Findings/ Diagnostic Studies: ROM Plus negative, reactive NST  Procedures: NST  Discharge Condition: good  Disposition: Discharge disposition: 01-Home or Self Care       Diet: Regular diet  Discharge Activity: Activity as tolerated  Discharge Instructions    Discharge activity:  No Restrictions   Complete by: As directed    Discharge diet:  No  restrictions   Complete by: As directed    Fetal Kick Count:  Lie on our left side for one hour after a meal, and count the number of times your baby kicks.  If it is less than 5 times, get up, move around and drink some juice.  Repeat the test 30 minutes later.  If it is still less than 5 kicks in an hour, notify your doctor.   Complete by: As directed    LABOR:  When conractions begin, you should start to time them from the beginning of one contraction to the beginning  of the next.  When contractions are 5 - 10 minutes apart or less and have been regular for at least an hour, you should call your health care provider.   Complete by: As directed    No sexual activity restrictions   Complete by: As directed    Notify physician for bleeding from the vagina   Complete by: As directed    Notify physician for blurring of vision or spots before the eyes   Complete by: As directed    Notify physician for chills or fever   Complete by: As directed    Notify physician for fainting spells, "black outs" or loss of consciousness   Complete by: As directed    Notify physician for increase in vaginal discharge   Complete by: As directed    Notify physician for leaking of fluid   Complete by: As directed    Notify physician for pain or burning when urinating   Complete by: As directed    Notify physician for pelvic pressure (sudden increase)   Complete by: As directed    Notify physician for severe or continued nausea or vomiting   Complete by: As directed    Notify physician for sudden gushing of fluid from the vagina (with or without continued leaking)   Complete by: As directed    Notify physician for sudden, constant, or occasional abdominal pain   Complete by: As directed    Notify physician if baby moving less than usual   Complete by: As directed      Allergies as of 06/16/2019   No Known Allergies     Medication List    TAKE these medications   Doxylamine-Pyridoxine 10-10 MG  Tbec Commonly known as: Diclegis Take 2 tablets by mouth at bedtime. If symptoms persist, add one tablet in the morning and one in the afternoon   multivitamin-prenatal 27-0.8 MG Tabs tablet Take 1 tablet by mouth daily at 12 noon.      ROM Plus negative, preterm labor precautions emphasized. Keep scheduled ROB 06/17/2019.  Signed: Rexene Agent, CNM  06/16/2019, 2:50 PM

## 2019-06-16 NOTE — Telephone Encounter (Signed)
Pt called today stated that about 30 mins ago she was walking to the mailbox and felt fluid running down her legs that soaked her underwear. I have asked pt if it had any type of smell or color to it pt stated no, pt said she hasn't had any contractions, and has not felt any more fluid coming out. I have advised pt that we do not have any openings in the office to see her right now and it would be best if she went to L&D to be checked for PROM. Pt very upset and scared, but will go to L&D now.

## 2019-06-17 ENCOUNTER — Ambulatory Visit (INDEPENDENT_AMBULATORY_CARE_PROVIDER_SITE_OTHER): Payer: 59 | Admitting: Obstetrics and Gynecology

## 2019-06-17 ENCOUNTER — Encounter: Payer: Self-pay | Admitting: Obstetrics and Gynecology

## 2019-06-17 VITALS — BP 102/54 | Wt 176.0 lb

## 2019-06-17 DIAGNOSIS — Z3493 Encounter for supervision of normal pregnancy, unspecified, third trimester: Secondary | ICD-10-CM

## 2019-06-17 DIAGNOSIS — Z3490 Encounter for supervision of normal pregnancy, unspecified, unspecified trimester: Secondary | ICD-10-CM

## 2019-06-17 DIAGNOSIS — Z3A34 34 weeks gestation of pregnancy: Secondary | ICD-10-CM

## 2019-06-17 NOTE — Progress Notes (Signed)
ROB No concerns  Denies lof, no vb Good FM 

## 2019-06-17 NOTE — Progress Notes (Signed)
    Routine Prenatal Care Visit  Subjective  Alexis Ward is a 23 y.o. G2P1001 at [redacted]w[redacted]d being seen today for ongoing prenatal care.  She is currently monitored for the following issues for this low-risk pregnancy and has Supervision of low-risk pregnancy, unspecified trimester and Indication for care in labor and delivery, antepartum on their problem list.  ----------------------------------------------------------------------------------- Patient reports no complaints.   Contractions: Not present. Vag. Bleeding: None.  Movement: Present. Denies leaking of fluid.  ----------------------------------------------------------------------------------- The following portions of the patient's history were reviewed and updated as appropriate: allergies, current medications, past family history, past medical history, past social history, past surgical history and problem list. Problem list updated.   Objective  Blood pressure (!) 102/54, weight 176 lb (79.8 kg), last menstrual period 11/01/2018. Pregravid weight 175 lb (79.4 kg) Total Weight Gain 1 lb (0.454 kg) Urinalysis:      Fetal Status: Fetal Heart Rate (bpm): 155 Fundal Height: 35 cm Movement: Present     General:  Alert, oriented and cooperative. Patient is in no acute distress.  Skin: Skin is warm and dry. No rash noted.   Cardiovascular: Normal heart rate noted  Respiratory: Normal respiratory effort, no problems with respiration noted  Abdomen: Soft, gravid, appropriate for gestational age. Pain/Pressure: Absent     Pelvic:  Cervical exam deferred        Extremities: Normal range of motion.     Mental Status: Normal mood and affect. Normal behavior. Normal judgment and thought content.     Assessment   23 y.o. G2P1001 at [redacted]w[redacted]d by  07/27/2019, by Ultrasound presenting for routine prenatal visit  Plan   pregnancy2 Problems (from 10/26/18 to present)    Problem Noted Resolved   Supervision of low-risk pregnancy, unspecified  trimester 12/16/2018 by Rexene Agent, CNM No   Overview Addendum 06/17/2019 11:42 AM by Homero Fellers, MD    [facility] Prenatal Labs  Dating Ultrasound at 8w 1d Blood type: A/Positive/-- (03/12 1022)   Genetic Screen Declines Antibody:Negative (03/12 1022)  Anatomic [place] Complete 03/10/2019 Rubella: 4.14 (03/12 1022) Varicella: Non-immune  GTT Early: N/A           Third trimester: 75 RPR: Non Reactive (03/12 1022)   Rhogam N/A HBsAg: Negative (03/12 1022)   TDaP vaccine     05/19/2019                   Flu Shot: 06/03/2019 HIV: Non Reactive (03/12 1022)   Baby Food Formula                           GBS:   Contraception  Pap: 01/03/2018, NILM  CBB     CS/VBAC    Support Person                  Gestational age appropriate obstetric precautions including but not limited to vaginal bleeding, contractions, leaking of fluid and fetal movement were reviewed in detail with the patient.    Return in about 2 weeks (around 07/01/2019) for ROB in person.  Homero Fellers MD [facility] 06/17/2019, 11:49 AM

## 2019-07-01 ENCOUNTER — Ambulatory Visit (INDEPENDENT_AMBULATORY_CARE_PROVIDER_SITE_OTHER): Payer: 59 | Admitting: Obstetrics and Gynecology

## 2019-07-01 ENCOUNTER — Encounter: Payer: Self-pay | Admitting: Obstetrics and Gynecology

## 2019-07-01 ENCOUNTER — Other Ambulatory Visit (HOSPITAL_COMMUNITY)
Admission: RE | Admit: 2019-07-01 | Discharge: 2019-07-01 | Disposition: A | Discharge status: Home / Self Care | Payer: 59 | Source: Ambulatory Visit | Attending: Obstetrics and Gynecology | Admitting: Obstetrics and Gynecology

## 2019-07-01 ENCOUNTER — Other Ambulatory Visit: Payer: Self-pay

## 2019-07-01 VITALS — BP 110/68 | Wt 180.0 lb

## 2019-07-01 DIAGNOSIS — Z3490 Encounter for supervision of normal pregnancy, unspecified, unspecified trimester: Secondary | ICD-10-CM

## 2019-07-01 DIAGNOSIS — O26893 Other specified pregnancy related conditions, third trimester: Secondary | ICD-10-CM

## 2019-07-01 DIAGNOSIS — Z3A36 36 weeks gestation of pregnancy: Secondary | ICD-10-CM

## 2019-07-01 DIAGNOSIS — L299 Pruritus, unspecified: Secondary | ICD-10-CM

## 2019-07-01 DIAGNOSIS — M5430 Sciatica, unspecified side: Secondary | ICD-10-CM

## 2019-07-01 LAB — OB RESULTS CONSOLE GC/CHLAMYDIA: Gonorrhea: NEGATIVE

## 2019-07-01 LAB — OB RESULTS CONSOLE GBS: GBS: NEGATIVE

## 2019-07-01 NOTE — Patient Instructions (Addendum)
Third Trimester of Pregnancy The third trimester is from week 28 through week 40 (months 7 through 9). The third trimester is a time when the unborn baby (fetus) is growing rapidly. At the end of the ninth month, the fetus is about 20 inches in length and weighs 6-10 pounds. Body changes during your third trimester Your body will continue to go through many changes during pregnancy. The changes vary from woman to woman. During the third trimester:  Your weight will continue to increase. You can expect to gain 25-35 pounds (11-16 kg) by the end of the pregnancy.  You may begin to get stretch marks on your hips, abdomen, and breasts.  You may urinate more often because the fetus is moving lower into your pelvis and pressing on your bladder.  You may develop or continue to have heartburn. This is caused by increased hormones that slow down muscles in the digestive tract.  You may develop or continue to have constipation because increased hormones slow digestion and cause the muscles that push waste through your intestines to relax.  You may develop hemorrhoids. These are swollen veins (varicose veins) in the rectum that can itch or be painful.  You may develop swollen, bulging veins (varicose veins) in your legs.  You may have increased body aches in the pelvis, back, or thighs. This is due to weight gain and increased hormones that are relaxing your joints.  You may have changes in your hair. These can include thickening of your hair, rapid growth, and changes in texture. Some women also have hair loss during or after pregnancy, or hair that feels dry or thin. Your hair will most likely return to normal after your baby is born.  Your breasts will continue to grow and they will continue to become tender. A yellow fluid (colostrum) may leak from your breasts. This is the first milk you are producing for your baby.  Your belly button may stick out.  You may notice more swelling in your hands,  face, or ankles.  You may have increased tingling or numbness in your hands, arms, and legs. The skin on your belly may also feel numb.  You may feel short of breath because of your expanding uterus.  You may have more problems sleeping. This can be caused by the size of your belly, increased need to urinate, and an increase in your body's metabolism.  You may notice the fetus "dropping," or moving lower in your abdomen (lightening).  You may have increased vaginal discharge.  You may notice your joints feel loose and you may have pain around your pelvic bone. What to expect at prenatal visits You will have prenatal exams every 2 weeks until week 36. Then you will have weekly prenatal exams. During a routine prenatal visit:  You will be weighed to make sure you and the baby are growing normally.  Your blood pressure will be taken.  Your abdomen will be measured to track your baby's growth.  The fetal heartbeat will be listened to.  Any test results from the previous visit will be discussed.  You may have a cervical check near your due date to see if your cervix has softened or thinned (effaced).  You will be tested for Group B streptococcus. This happens between 35 and 37 weeks. Your health care provider may ask you:  What your birth plan is.  How you are feeling.  If you are feeling the baby move.  If you have had any abnormal   symptoms, such as leaking fluid, bleeding, severe headaches, or abdominal cramping.  If you are using any tobacco products, including cigarettes, chewing tobacco, and electronic cigarettes.  If you have any questions. Other tests or screenings that may be performed during your third trimester include:  Blood tests that check for low iron levels (anemia).  Fetal testing to check the health, activity level, and growth of the fetus. Testing is done if you have certain medical conditions or if there are problems during the pregnancy.  Nonstress test  (NST). This test checks the health of your baby to make sure there are no signs of problems, such as the baby not getting enough oxygen. During this test, a belt is placed around your belly. The baby is made to move, and its heart rate is monitored during movement. What is false labor? False labor is a condition in which you feel small, irregular tightenings of the muscles in the womb (contractions) that usually go away with rest, changing position, or drinking water. These are called Braxton Hicks contractions. Contractions may last for hours, days, or even weeks before true labor sets in. If contractions come at regular intervals, become more frequent, increase in intensity, or become painful, you should see your health care provider. What are the signs of labor?  Abdominal cramps.  Regular contractions that start at 10 minutes apart and become stronger and more frequent with time.  Contractions that start on the top of the uterus and spread down to the lower abdomen and back.  Increased pelvic pressure and dull back pain.  A watery or bloody mucus discharge that comes from the vagina.  Leaking of amniotic fluid. This is also known as your "water breaking." It could be a slow trickle or a gush. Let your health care provider know if it has a color or strange odor. If you have any of these signs, call your health care provider right away, even if it is before your due date. Follow these instructions at home: Medicines  Follow your health care provider's instructions regarding medicine use. Specific medicines may be either safe or unsafe to take during pregnancy.  Take a prenatal vitamin that contains at least 600 micrograms (mcg) of folic acid.  If you develop constipation, try taking a stool softener if your health care provider approves. Eating and drinking   Eat a balanced diet that includes fresh fruits and vegetables, whole grains, good sources of protein such as meat, eggs, or tofu,  and low-fat dairy. Your health care provider will help you determine the amount of weight gain that is right for you.  Avoid raw meat and uncooked cheese. These carry germs that can cause birth defects in the baby.  If you have low calcium intake from food, talk to your health care provider about whether you should take a daily calcium supplement.  Eat four or five small meals rather than three large meals a day.  Limit foods that are high in fat and processed sugars, such as fried and sweet foods.  To prevent constipation: ? Drink enough fluid to keep your urine clear or pale yellow. ? Eat foods that are high in fiber, such as fresh fruits and vegetables, whole grains, and beans. Activity  Exercise only as directed by your health care provider. Most women can continue their usual exercise routine during pregnancy. Try to exercise for 30 minutes at least 5 days a week. Stop exercising if you experience uterine contractions.  Avoid heavy lifting.  Do   not exercise in extreme heat or humidity, or at high altitudes.  Wear low-heel, comfortable shoes.  Practice good posture.  You may continue to have sex unless your health care provider tells you otherwise. Relieving pain and discomfort  Take frequent breaks and rest with your legs elevated if you have leg cramps or low back pain.  Take warm sitz baths to soothe any pain or discomfort caused by hemorrhoids. Use hemorrhoid cream if your health care provider approves.  Wear a good support bra to prevent discomfort from breast tenderness.  If you develop varicose veins: ? Wear support pantyhose or compression stockings as told by your healthcare provider. ? Elevate your feet for 15 minutes, 3-4 times a day. Prenatal care  Write down your questions. Take them to your prenatal visits.  Keep all your prenatal visits as told by your health care provider. This is important. Safety  Wear your seat belt at all times when driving.  Make  a list of emergency phone numbers, including numbers for family, friends, the hospital, and police and fire departments. General instructions  Avoid cat litter boxes and soil used by cats. These carry germs that can cause birth defects in the baby. If you have a cat, ask someone to clean the litter box for you.  Do not travel far distances unless it is absolutely necessary and only with the approval of your health care provider.  Do not use hot tubs, steam rooms, or saunas.  Do not drink alcohol.  Do not use any products that contain nicotine or tobacco, such as cigarettes and e-cigarettes. If you need help quitting, ask your health care provider.  Do not use any medicinal herbs or unprescribed drugs. These chemicals affect the formation and growth of the baby.  Do not douche or use tampons or scented sanitary pads.  Do not cross your legs for long periods of time.  To prepare for the arrival of your baby: ? Take prenatal classes to understand, practice, and ask questions about labor and delivery. ? Make a trial run to the hospital. ? Visit the hospital and tour the maternity area. ? Arrange for maternity or paternity leave through employers. ? Arrange for family and friends to take care of pets while you are in the hospital. ? Purchase a rear-facing car seat and make sure you know how to install it in your car. ? Pack your hospital bag. ? Prepare the baby's nursery. Make sure to remove all pillows and stuffed animals from the baby's crib to prevent suffocation.  Visit your dentist if you have not gone during your pregnancy. Use a soft toothbrush to brush your teeth and be gentle when you floss. Contact a health care provider if:  You are unsure if you are in labor or if your water has broken.  You become dizzy.  You have mild pelvic cramps, pelvic pressure, or nagging pain in your abdominal area.  You have lower back pain.  You have persistent nausea, vomiting, or diarrhea.   You have an unusual or bad smelling vaginal discharge.  You have pain when you urinate. Get help right away if:  Your water breaks before 37 weeks.  You have regular contractions less than 5 minutes apart before 37 weeks.  You have a fever.  You are leaking fluid from your vagina.  You have spotting or bleeding from your vagina.  You have severe abdominal pain or cramping.  You have rapid weight loss or weight gain.  You have   shortness of breath with chest pain.  You notice sudden or extreme swelling of your face, hands, ankles, feet, or legs.  Your baby makes fewer than 10 movements in 2 hours.  You have severe headaches that do not go away when you take medicine.  You have vision changes. Summary  The third trimester is from week 28 through week 40, months 7 through 9. The third trimester is a time when the unborn baby (fetus) is growing rapidly.  During the third trimester, your discomfort may increase as you and your baby continue to gain weight. You may have abdominal, leg, and back pain, sleeping problems, and an increased need to urinate.  During the third trimester your breasts will keep growing and they will continue to become tender. A yellow fluid (colostrum) may leak from your breasts. This is the first milk you are producing for your baby.  False labor is a condition in which you feel small, irregular tightenings of the muscles in the womb (contractions) that eventually go away. These are called Braxton Hicks contractions. Contractions may last for hours, days, or even weeks before true labor sets in.  Signs of labor can include: abdominal cramps; regular contractions that start at 10 minutes apart and become stronger and more frequent with time; watery or bloody mucus discharge that comes from the vagina; increased pelvic pressure and dull back pain; and leaking of amniotic fluid. This information is not intended to replace advice given to you by your health  care provider. Make sure you discuss any questions you have with your health care provider. Document Released: 09/05/2001 Document Revised: 01/02/2019 Document Reviewed: 10/17/2016 Elsevier Patient Education  2020 [employer].  Sciatica Rehab Ask your health care provider which exercises are safe for you. Do exercises exactly as told by your health care provider and adjust them as directed. It is normal to feel mild stretching, pulling, tightness, or discomfort as you do these exercises. Stop right away if you feel sudden pain or your pain gets worse. Do not begin these exercises until told by your health care provider. Stretching and range-of-motion exercises These exercises warm up your muscles and joints and improve the movement and flexibility of your hips and back. These exercises also help to relieve pain, numbness, and tingling. Sciatic nerve glide 1. Sit in a chair with your head facing down toward your chest. Place your hands behind your back. Let your shoulders slump forward. 2. Slowly straighten one of your legs while you tilt your head back as if you are looking toward the ceiling. Only straighten your leg as far as you can without making your symptoms worse. 3. Hold this position for __________ seconds. 4. Slowly return to the starting position. 5. Repeat with your other leg. Repeat __________ times. Complete this exercise __________ times a day. Knee to chest with hip adduction and internal rotation  1. Lie on your back on a firm surface with both legs straight. 2. Bend one of your knees and move it up toward your chest until you feel a gentle stretch in your lower back and buttock. Then, move your knee toward the shoulder that is on the opposite side from your leg. This is hip adduction and internal rotation. ? Hold your leg in this position by holding on to the front of your knee. 3. Hold this position for __________ seconds. 4. Slowly return to the starting position. 5.  Repeat with your other leg. Repeat __________ times. Complete this exercise __________ times a  day. Prone extension on elbows  1. Lie on your abdomen on a firm surface. A bed may be too soft for this exercise. 2. Prop yourself up on your elbows. 3. Use your arms to help lift your chest up until you feel a gentle stretch in your abdomen and your lower back. ? This will place some of your body weight on your elbows. If this is uncomfortable, try stacking pillows under your chest. ? Your hips should stay down, against the surface that you are lying on. Keep your hip and back muscles relaxed. 4. Hold this position for __________ seconds. 5. Slowly relax your upper body and return to the starting position. Repeat __________ times. Complete this exercise __________ times a day. Strengthening exercises These exercises build strength and endurance in your back. Endurance is the ability to use your muscles for a long time, even after they get tired. Pelvic tilt This exercise strengthens the muscles that lie deep in the abdomen. 1. Lie on your back on a firm surface. Bend your knees and keep your feet flat on the floor. 2. Tense your abdominal muscles. Tip your pelvis up toward the ceiling and flatten your lower back into the floor. ? To help with this exercise, you may place a small towel under your lower back and try to push your back into the towel. 3. Hold this position for __________ seconds. 4. Let your muscles relax completely before you repeat this exercise. Repeat __________ times. Complete this exercise __________ times a day. Alternating arm and leg raises  1. Get on your hands and knees on a firm surface. If you are on a hard floor, you may want to use padding, such as an exercise mat, to cushion your knees. 2. Line up your arms and legs. Your hands should be directly below your shoulders, and your knees should be directly below your hips. 3. Lift your left leg behind you. At the same  time, raise your right arm and straighten it in front of you. ? Do not lift your leg higher than your hip. ? Do not lift your arm higher than your shoulder. ? Keep your abdominal and back muscles tight. ? Keep your hips facing the ground. ? Do not arch your back. ? Keep your balance carefully, and do not hold your breath. 4. Hold this position for __________ seconds. 5. Slowly return to the starting position. 6. Repeat with your right leg and your left arm. Repeat __________ times. Complete this exercise __________ times a day. Posture and body mechanics Good posture and healthy body mechanics can help to relieve stress in your body's tissues and joints. Body mechanics refers to the movements and positions of your body while you do your daily activities. Posture is part of body mechanics. Good posture means:  Your spine is in its natural S-curve position (neutral).  Your shoulders are pulled back slightly.  Your head is not tipped forward. Follow these guidelines to improve your posture and body mechanics in your everyday activities. Standing   When standing, keep your spine neutral and your feet about hip width apart. Keep a slight bend in your knees. Your ears, shoulders, and hips should line up.  When you do a task in which you stand in one place for a long time, place one foot up on a stable object that is 2-4 inches (5-10 cm) high, such as a footstool. This helps keep your spine neutral. Sitting   When sitting, keep your spine neutral and  keep your feet flat on the floor. Use a footrest, if necessary, and keep your thighs parallel to the floor. Avoid rounding your shoulders, and avoid tilting your head forward.  When working at a desk or a computer, keep your desk at a height where your hands are slightly lower than your elbows. Slide your chair under your desk so you are close enough to maintain good posture.  When working at a computer, place your monitor at a height where you  are looking straight ahead and you do not have to tilt your head forward or downward to look at the screen. Resting  When lying down and resting, avoid positions that are most painful for you.  If you have pain with activities such as sitting, bending, stooping, or squatting, lie in a position in which your body does not bend very much. For example, avoid curling up on your side with your arms and knees near your chest (fetal position).  If you have pain with activities such as standing for a long time or reaching with your arms, lie with your spine in a neutral position and bend your knees slightly. Try the following positions: ? Lying on your side with a pillow between your knees. ? Lying on your back with a pillow under your knees. Lifting   When lifting objects, keep your feet at least shoulder width apart and tighten your abdominal muscles.  Bend your knees and hips and keep your spine neutral. It is important to lift using the strength of your legs, not your back. Do not lock your knees straight out.  Always ask for help to lift heavy or awkward objects. This information is not intended to replace advice given to you by your health care provider. Make sure you discuss any questions you have with your health care provider. Document Released: 09/11/2005 Document Revised: 01/03/2019 Document Reviewed: 10/03/2018 Elsevier Patient Education  2020 [employer].

## 2019-07-01 NOTE — Progress Notes (Signed)
ROB GBS/Aptima today C/o pelvic pressures Denies lof, no vb, Good FM

## 2019-07-01 NOTE — Progress Notes (Signed)
    Routine Prenatal Care Visit  Subjective  Alexis Ward is a 23 y.o. G2P1001 at [redacted]w[redacted]d being seen today for ongoing prenatal care.  She is currently monitored for the following issues for this low-risk pregnancy and has Supervision of low-risk pregnancy, unspecified trimester and Indication for care in labor and delivery, antepartum on their problem list.  ----------------------------------------------------------------------------------- Patient reports right sided sciatic pain affecting ambulation.   Contractions: Not present. Vag. Bleeding: None.  Movement: Present. Denies leaking of fluid.  ----------------------------------------------------------------------------------- The following portions of the patient's history were reviewed and updated as appropriate: allergies, current medications, past family history, past medical history, past social history, past surgical history and problem list. Problem list updated.   Objective  Blood pressure 110/68, weight 180 lb (81.6 kg), last menstrual period 11/01/2018. Pregravid weight 175 lb (79.4 kg) Total Weight Gain 5 lb (2.268 kg) Urinalysis:      Fetal Status: Fetal Heart Rate (bpm): 160 Fundal Height: 36 cm Movement: Present  Presentation: Vertex  General:  Alert, oriented and cooperative. Patient is in no acute distress.  Skin: Skin is warm and dry. No rash noted.   Cardiovascular: Normal heart rate noted  Respiratory: Normal respiratory effort, no problems with respiration noted  Abdomen: Soft, gravid, appropriate for gestational age. Pain/Pressure: Present     Pelvic:  Cervical exam performed Dilation: 1 Effacement (%): 50 Station: -3  Extremities: Normal range of motion.     Mental Status: Normal mood and affect. Normal behavior. Normal judgment and thought content.     Assessment   23 y.o. G2P1001 at [redacted]w[redacted]d by  07/27/2019, by Ultrasound presenting for routine prenatal visit  Plan   pregnancy2 Problems (from 10/26/18 to  present)    Problem Noted Resolved   Supervision of low-risk pregnancy, unspecified trimester 12/16/2018 by Rexene Agent, CNM No   Overview Addendum 06/17/2019 11:50 AM by Homero Fellers, MD    [facility] Prenatal Labs  Dating Ultrasound at 8w 1d Blood type: A/Positive/-- (03/12 1022)   Genetic Screen Declines Antibody:Negative (03/12 1022)  Anatomic [place] Complete 03/10/2019 Rubella: 4.14 (03/12 1022) Varicella: Non-immune  GTT Early: N/A           Third trimester: 75 RPR: Non Reactive (03/12 1022)   Rhogam N/A HBsAg: Negative (03/12 1022)   TDaP vaccine  05/19/2019                      Flu Shot: 06/03/2019 HIV: Non Reactive (03/12 1022)   Baby Food Formula                           GBS:   Contraception  Pap: 01/03/2018, NILM  CBB     CS/VBAC    Support Person                  Gestational age appropriate obstetric precautions including but not limited to vaginal bleeding, contractions, leaking of fluid and fetal movement were reviewed in detail with the patient.    Referral to PT for severe right sciatic nerve pain, affecting ambulation Check bile acid for persistent itching on RLQ abdomen GBS/GC/CT today  Return in about 1 week (around 07/08/2019) for ROB in person.  Homero Fellers MD [facility], [facility] 07/01/2019, 11:57 AM

## 2019-07-03 LAB — BILE ACIDS, TOTAL: Bile Acids Total: 3.1 umol/L (ref 0.0–10.0)

## 2019-07-03 NOTE — Progress Notes (Signed)
WNL- released to mychart

## 2019-07-05 LAB — CULTURE, BETA STREP (GROUP B ONLY): Strep Gp B Culture: NEGATIVE

## 2019-07-10 LAB — CERVICOVAGINAL ANCILLARY ONLY
Chlamydia: NEGATIVE
Comment: NEGATIVE
Comment: NORMAL
Neisseria Gonorrhea: NEGATIVE

## 2019-07-11 ENCOUNTER — Encounter: Payer: Self-pay | Admitting: Obstetrics and Gynecology

## 2019-07-11 ENCOUNTER — Other Ambulatory Visit: Payer: Self-pay

## 2019-07-11 ENCOUNTER — Ambulatory Visit (INDEPENDENT_AMBULATORY_CARE_PROVIDER_SITE_OTHER): Payer: 59 | Admitting: Obstetrics and Gynecology

## 2019-07-11 VITALS — BP 110/62 | Wt 177.0 lb

## 2019-07-11 DIAGNOSIS — O26843 Uterine size-date discrepancy, third trimester: Secondary | ICD-10-CM

## 2019-07-11 DIAGNOSIS — Z3490 Encounter for supervision of normal pregnancy, unspecified, unspecified trimester: Secondary | ICD-10-CM

## 2019-07-11 DIAGNOSIS — Z3A38 38 weeks gestation of pregnancy: Secondary | ICD-10-CM

## 2019-07-11 NOTE — Progress Notes (Signed)
ROB C/o sharp pain in pelvic area  Denies lof, no vb Good FM

## 2019-07-11 NOTE — Patient Instructions (Signed)
Third Trimester of Pregnancy The third trimester is from week 28 through week 40 (months 7 through 9). The third trimester is a time when the unborn baby (fetus) is growing rapidly. At the end of the ninth month, the fetus is about 20 inches in length and weighs 6-10 pounds. Body changes during your third trimester Your body will continue to go through many changes during pregnancy. The changes vary from woman to woman. During the third trimester:  Your weight will continue to increase. You can expect to gain 25-35 pounds (11-16 kg) by the end of the pregnancy.  You may begin to get stretch marks on your hips, abdomen, and breasts.  You may urinate more often because the fetus is moving lower into your pelvis and pressing on your bladder.  You may develop or continue to have heartburn. This is caused by increased hormones that slow down muscles in the digestive tract.  You may develop or continue to have constipation because increased hormones slow digestion and cause the muscles that push waste through your intestines to relax.  You may develop hemorrhoids. These are swollen veins (varicose veins) in the rectum that can itch or be painful.  You may develop swollen, bulging veins (varicose veins) in your legs.  You may have increased body aches in the pelvis, back, or thighs. This is due to weight gain and increased hormones that are relaxing your joints.  You may have changes in your hair. These can include thickening of your hair, rapid growth, and changes in texture. Some women also have hair loss during or after pregnancy, or hair that feels dry or thin. Your hair will most likely return to normal after your baby is born.  Your breasts will continue to grow and they will continue to become tender. A yellow fluid (colostrum) may leak from your breasts. This is the first milk you are producing for your baby.  Your belly button may stick out.  You may notice more swelling in your hands,  face, or ankles.  You may have increased tingling or numbness in your hands, arms, and legs. The skin on your belly may also feel numb.  You may feel short of breath because of your expanding uterus.  You may have more problems sleeping. This can be caused by the size of your belly, increased need to urinate, and an increase in your body's metabolism.  You may notice the fetus "dropping," or moving lower in your abdomen (lightening).  You may have increased vaginal discharge.  You may notice your joints feel loose and you may have pain around your pelvic bone. What to expect at prenatal visits You will have prenatal exams every 2 weeks until week 36. Then you will have weekly prenatal exams. During a routine prenatal visit:  You will be weighed to make sure you and the baby are growing normally.  Your blood pressure will be taken.  Your abdomen will be measured to track your baby's growth.  The fetal heartbeat will be listened to.  Any test results from the previous visit will be discussed.  You may have a cervical check near your due date to see if your cervix has softened or thinned (effaced).  You will be tested for Group B streptococcus. This happens between 35 and 37 weeks. Your health care provider may ask you:  What your birth plan is.  How you are feeling.  If you are feeling the baby move.  If you have had any abnormal   symptoms, such as leaking fluid, bleeding, severe headaches, or abdominal cramping.  If you are using any tobacco products, including cigarettes, chewing tobacco, and electronic cigarettes.  If you have any questions. Other tests or screenings that may be performed during your third trimester include:  Blood tests that check for low iron levels (anemia).  Fetal testing to check the health, activity level, and growth of the fetus. Testing is done if you have certain medical conditions or if there are problems during the pregnancy.  Nonstress test  (NST). This test checks the health of your baby to make sure there are no signs of problems, such as the baby not getting enough oxygen. During this test, a belt is placed around your belly. The baby is made to move, and its heart rate is monitored during movement. What is false labor? False labor is a condition in which you feel small, irregular tightenings of the muscles in the womb (contractions) that usually go away with rest, changing position, or drinking water. These are called Braxton Hicks contractions. Contractions may last for hours, days, or even weeks before true labor sets in. If contractions come at regular intervals, become more frequent, increase in intensity, or become painful, you should see your health care provider. What are the signs of labor?  Abdominal cramps.  Regular contractions that start at 10 minutes apart and become stronger and more frequent with time.  Contractions that start on the top of the uterus and spread down to the lower abdomen and back.  Increased pelvic pressure and dull back pain.  A watery or bloody mucus discharge that comes from the vagina.  Leaking of amniotic fluid. This is also known as your "water breaking." It could be a slow trickle or a gush. Let your health care provider know if it has a color or strange odor. If you have any of these signs, call your health care provider right away, even if it is before your due date. Follow these instructions at home: Medicines  Follow your health care provider's instructions regarding medicine use. Specific medicines may be either safe or unsafe to take during pregnancy.  Take a prenatal vitamin that contains at least 600 micrograms (mcg) of folic acid.  If you develop constipation, try taking a stool softener if your health care provider approves. Eating and drinking   Eat a balanced diet that includes fresh fruits and vegetables, whole grains, good sources of protein such as meat, eggs, or tofu,  and low-fat dairy. Your health care provider will help you determine the amount of weight gain that is right for you.  Avoid raw meat and uncooked cheese. These carry germs that can cause birth defects in the baby.  If you have low calcium intake from food, talk to your health care provider about whether you should take a daily calcium supplement.  Eat four or five small meals rather than three large meals a day.  Limit foods that are high in fat and processed sugars, such as fried and sweet foods.  To prevent constipation: ? Drink enough fluid to keep your urine clear or pale yellow. ? Eat foods that are high in fiber, such as fresh fruits and vegetables, whole grains, and beans. Activity  Exercise only as directed by your health care provider. Most women can continue their usual exercise routine during pregnancy. Try to exercise for 30 minutes at least 5 days a week. Stop exercising if you experience uterine contractions.  Avoid heavy lifting.  Do   not exercise in extreme heat or humidity, or at high altitudes.  Wear low-heel, comfortable shoes.  Practice good posture.  You may continue to have sex unless your health care provider tells you otherwise. Relieving pain and discomfort  Take frequent breaks and rest with your legs elevated if you have leg cramps or low back pain.  Take warm sitz baths to soothe any pain or discomfort caused by hemorrhoids. Use hemorrhoid cream if your health care provider approves.  Wear a good support bra to prevent discomfort from breast tenderness.  If you develop varicose veins: ? Wear support pantyhose or compression stockings as told by your healthcare provider. ? Elevate your feet for 15 minutes, 3-4 times a day. Prenatal care  Write down your questions. Take them to your prenatal visits.  Keep all your prenatal visits as told by your health care provider. This is important. Safety  Wear your seat belt at all times when driving.  Make  a list of emergency phone numbers, including numbers for family, friends, the hospital, and police and fire departments. General instructions  Avoid cat litter boxes and soil used by cats. These carry germs that can cause birth defects in the baby. If you have a cat, ask someone to clean the litter box for you.  Do not travel far distances unless it is absolutely necessary and only with the approval of your health care provider.  Do not use hot tubs, steam rooms, or saunas.  Do not drink alcohol.  Do not use any products that contain nicotine or tobacco, such as cigarettes and e-cigarettes. If you need help quitting, ask your health care provider.  Do not use any medicinal herbs or unprescribed drugs. These chemicals affect the formation and growth of the baby.  Do not douche or use tampons or scented sanitary pads.  Do not cross your legs for long periods of time.  To prepare for the arrival of your baby: ? Take prenatal classes to understand, practice, and ask questions about labor and delivery. ? Make a trial run to the hospital. ? Visit the hospital and tour the maternity area. ? Arrange for maternity or paternity leave through employers. ? Arrange for family and friends to take care of pets while you are in the hospital. ? Purchase a rear-facing car seat and make sure you know how to install it in your car. ? Pack your hospital bag. ? Prepare the baby's nursery. Make sure to remove all pillows and stuffed animals from the baby's crib to prevent suffocation.  Visit your dentist if you have not gone during your pregnancy. Use a soft toothbrush to brush your teeth and be gentle when you floss. Contact a health care provider if:  You are unsure if you are in labor or if your water has broken.  You become dizzy.  You have mild pelvic cramps, pelvic pressure, or nagging pain in your abdominal area.  You have lower back pain.  You have persistent nausea, vomiting, or diarrhea.   You have an unusual or bad smelling vaginal discharge.  You have pain when you urinate. Get help right away if:  Your water breaks before 37 weeks.  You have regular contractions less than 5 minutes apart before 37 weeks.  You have a fever.  You are leaking fluid from your vagina.  You have spotting or bleeding from your vagina.  You have severe abdominal pain or cramping.  You have rapid weight loss or weight gain.  You have   shortness of breath with chest pain.  You notice sudden or extreme swelling of your face, hands, ankles, feet, or legs.  Your baby makes fewer than 10 movements in 2 hours.  You have severe headaches that do not go away when you take medicine.  You have vision changes. Summary  The third trimester is from week 28 through week 40, months 7 through 9. The third trimester is a time when the unborn baby (fetus) is growing rapidly.  During the third trimester, your discomfort may increase as you and your baby continue to gain weight. You may have abdominal, leg, and back pain, sleeping problems, and an increased need to urinate.  During the third trimester your breasts will keep growing and they will continue to become tender. A yellow fluid (colostrum) may leak from your breasts. This is the first milk you are producing for your baby.  False labor is a condition in which you feel small, irregular tightenings of the muscles in the womb (contractions) that eventually go away. These are called Braxton Hicks contractions. Contractions may last for hours, days, or even weeks before true labor sets in.  Signs of labor can include: abdominal cramps; regular contractions that start at 10 minutes apart and become stronger and more frequent with time; watery or bloody mucus discharge that comes from the vagina; increased pelvic pressure and dull back pain; and leaking of amniotic fluid. This information is not intended to replace advice given to you by your health  care provider. Make sure you discuss any questions you have with your health care provider. Document Released: 09/05/2001 Document Revised: 01/02/2019 Document Reviewed: 10/17/2016 Elsevier Patient Education  2020 [employer]  

## 2019-07-11 NOTE — Progress Notes (Signed)
    Routine Prenatal Care Visit  Subjective  Alexis Ward is a 23 y.o. G2P1001 at [redacted]w[redacted]d being seen today for ongoing prenatal care.  She is currently monitored for the following issues for this low-risk pregnancy and has Supervision of low-risk pregnancy, unspecified trimester and Indication for care in labor and delivery, antepartum on their problem list.  ----------------------------------------------------------------------------------- Patient reports sharp shooting pelvic pain.   Contractions: Not present. Vag. Bleeding: None.  Movement: Present. Denies leaking of fluid.  ----------------------------------------------------------------------------------- The following portions of the patient's history were reviewed and updated as appropriate: allergies, current medications, past family history, past medical history, past social history, past surgical history and problem list. Problem list updated.   Objective  Blood pressure 110/62, weight 177 lb (80.3 kg), last menstrual period 11/01/2018. Pregravid weight 175 lb (79.4 kg) Total Weight Gain 2 lb (0.907 kg) Urinalysis:      Fetal Status: Fetal Heart Rate (bpm): 160 Fundal Height: 26 cm Movement: Present  Presentation: Vertex  General:  Alert, oriented and cooperative. Patient is in no acute distress.  Skin: Skin is warm and dry. No rash noted.   Cardiovascular: Normal heart rate noted  Respiratory: Normal respiratory effort, no problems with respiration noted  Abdomen: Soft, gravid, appropriate for gestational age. Pain/Pressure: Present     Pelvic:  Cervical exam performed Dilation: 1 Effacement (%): 50 Station: -3  Extremities: Normal range of motion.     Mental Status: Normal mood and affect. Normal behavior. Normal judgment and thought content.    Assessment   23 y.o. G2P1001 at [redacted]w[redacted]d by  07/27/2019, by Ultrasound presenting for routine prenatal visit  Plan   pregnancy2 Problems (from 10/26/18 to present)    Problem  Noted Resolved   Supervision of low-risk pregnancy, unspecified trimester 12/16/2018 by Rexene Agent, CNM No   Overview Addendum 06/17/2019 11:50 AM by Homero Fellers, MD    [facility]inic [facility] Prenatal Labs  Dating Ultrasound at 8w 1d Blood type: A/Positive/-- (03/12 1022)   Genetic Screen Declines Antibody:Negative (03/12 1022)  Anatomic [place] Complete 03/10/2019 Rubella: 4.14 (03/12 1022) Varicella: Non-immune  GTT Early: N/A           Third trimester: 76 RPR: Non Reactive (03/12 1022)   Rhogam N/A HBsAg: Negative (03/12 1022)   TDaP vaccine  05/19/2019                      Flu Shot: 06/03/2019 HIV: Non Reactive (03/12 1022)   Baby Food Formula                           GBS:   Contraception  Pap: 01/03/2018, NILM  CBB     CS/VBAC    Support Person                  Gestational age appropriate obstetric precautions including but not limited to vaginal bleeding, contractions, leaking of fluid and fetal movement were reviewed in detail with the patient.    Would like membrane sweep next visit. Growth Korea  Return in about 1 week (around 07/18/2019) for ROB in person.  Homero Fellers MD [facility], [facility] 07/11/2019, 12:17 PM

## 2019-07-18 ENCOUNTER — Encounter: Payer: Self-pay | Admitting: Obstetrics and Gynecology

## 2019-07-18 ENCOUNTER — Other Ambulatory Visit: Payer: Self-pay

## 2019-07-18 ENCOUNTER — Ambulatory Visit (INDEPENDENT_AMBULATORY_CARE_PROVIDER_SITE_OTHER): Payer: 59

## 2019-07-18 ENCOUNTER — Ambulatory Visit (INDEPENDENT_AMBULATORY_CARE_PROVIDER_SITE_OTHER): Payer: 59 | Admitting: Obstetrics and Gynecology

## 2019-07-18 VITALS — BP 118/74 | Wt 181.0 lb

## 2019-07-18 DIAGNOSIS — Z3490 Encounter for supervision of normal pregnancy, unspecified, unspecified trimester: Secondary | ICD-10-CM

## 2019-07-18 DIAGNOSIS — O26843 Uterine size-date discrepancy, third trimester: Secondary | ICD-10-CM

## 2019-07-18 DIAGNOSIS — Z3493 Encounter for supervision of normal pregnancy, unspecified, third trimester: Secondary | ICD-10-CM

## 2019-07-18 DIAGNOSIS — Z3A38 38 weeks gestation of pregnancy: Secondary | ICD-10-CM

## 2019-07-18 NOTE — Progress Notes (Signed)
  [facility] REGIONAL BIRTHPLACE INDUCTION ASSESSMENT [place] Oct 20, 1995 Medical record #: [id number] Phone #:  Home Phone [phone]  Mobile [phone]    [facility]renatal Provider:[facility]ivering Group[facility] Proposed admission date/time:07/20/2019 at 0800 AM Method of induction:Cytotec  Weight: Filed Weights10/23/20 1428Weight:181 lb (82.1 kg) BMI Body mass index is 33.11 kg/m. HIV Negative HSV Negative EDC Estimated Date of Delivery: 11/1/20based on:US at [redacted] wks  Gestational age on admission: [redacted]w[redacted]d Gravidity/parity:G2P1001  Cervix Score   0 1 2 3   Position Posterior Midposition Anterior   Consistency Firm Medium Soft   Effacement (%) 0-30 40-50 60-70 >80  Dilation (cm) Closed 1-2 3-4 >5  Baby's station -3 -2 -1 +1, +2   Bishop Score:3   select indication(s) below Elective induction ?39 weeks multiparous patient  Medical Indications Adapted from [facility] #[id number], "Medically Indicated Late Preterm and Early Term Deliveries," 2013.  PLACENTAL / UTERINE ISSUES FETAL ISSUES MATERNAL ISSUES  ? Placenta previa (36.0-37.6) ? Isoimmunization (37.0-38.6) ? Preeclampsia without severe features or gestational HTN (37.0)  ? Suspected accreta (34.0-35.6) ? Growth Restriction Nelda Marse[place]) ? Preeclampsia with severe features (34.0)  ? Prior classical CD, uterine window, rupture (36.0-37.6) ? Isolated (38.0-39.6) ? Chronic HTN (38.0-39.6)  ? Prior myomectomy (37.0-38.6) ? Concurrent findings (34.0-37.6) ? Cholestasis (37.0)  ? 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 Growth Restriction (Twins) ? Diabetes  ? Placental abruption (chronic) ? Di-Di Isolated (36.0-37.6) ? Pregestational, controlled (39.0)  OBSTETRIC ISSUES ? Di-Di concurrent findings (32.0-34.6) ? Pregestational, uncontrolled (37.0-39.0)  ? Postdates ? (41 weeks) ? Mo-Di isolated (32.0-34.6) ? Pregestational, vascular compromise (37.0- 39.0)  ? PPROM (34.0) ? Multiple Gestation ? Gestational, diet  controlled (40.0)  ? Hx of IUFD (39.0 weeks) ? Di-Di (38.0-38.6) ? Gestational, med controlled (39.0)  ? Polyhydramnios, mild/moderate; SDV 8-16 or AFI 25-35 (39.0) ? Mo-Di (36.0-37.6) ? Gestational, uncontrolled (38.0-39.0)  ? Oligohydramnios (36.0-37.6); MVP <2 cm  For indications not listed above, delivery recommendations from maternal-fetal medicine consultant occurred on: Date:n/a with Dr. Durward Parcel for indication of:n/a  Provider Signature: Prentice Docker Scheduled by: Betti Cruz., RN Date:07/18/2019 5:33 PM   Call [phone] to finalize the induction date/time  [id number] (07/17)

## 2019-07-18 NOTE — Progress Notes (Signed)
Routine Prenatal Care Visit  Subjective  Alexis Ward is a 23 y.o. G2P1001 at [redacted]w[redacted]d being seen today for ongoing prenatal care.  She is currently monitored for the following issues for this low-risk pregnancy and has Supervision of low-risk pregnancy, unspecified trimester and Indication for care in labor and delivery, antepartum on their problem list.  ----------------------------------------------------------------------------------- Patient reports no complaints.   Contractions: Not present. Vag. Bleeding: None.  Movement: Present. Leaking Fluid denies.  Growth u/s today 47th %ile, AFI 17.4 cm ----------------------------------------------------------------------------------- The following portions of the patient's history were reviewed and updated as appropriate: allergies, current medications, past family history, past medical history, past social history, past surgical history and problem list. Problem list updated.  Objective  Blood pressure 118/74, weight 181 lb (82.1 kg), last menstrual period 11/01/2018. Pregravid weight 175 lb (79.4 kg) Total Weight Gain 6 lb (2.722 kg) Urinalysis: Urine Protein    Urine Glucose    Fetal Status: Fetal Heart Rate (bpm): 146   Movement: Present  Presentation: Vertex  General:  Alert, oriented and cooperative. Patient is in no acute distress.  Skin: Skin is warm and dry. No rash noted.   Cardiovascular: Normal heart rate noted  Respiratory: Normal respiratory effort, no problems with respiration noted  Abdomen: Soft, gravid, appropriate for gestational age. Pain/Pressure: Absent     Pelvic:  Cervical exam deferred        Extremities: Normal range of motion.     Mental Status: Normal mood and affect. Normal behavior. Normal judgment and thought content.   Imaging Results US Ob Follow Up  Result Date: 07/18/2019 Patient Name: KELICIA YOUTZ DOB: 06/21/1996 MRN: [id number] ULTRASOUND REPORT Location: [facility] Date of Service:  07/18/2019 Indications:growth/afi Findings: Nelda [place]se[place] intrauterine pregnancy is visualized with FHR at 174 BPM. Biometrics give an (U/S) Gestational age of [redacted]w[redacted]d and an (U/S) EDD of 08/07/2020; this correlates with the clinically established Estimated Date of Delivery: 07/27/2019.  Fetal presentation is Cephalic. Placenta: anterior. Grade: 3 AFI: 17.4 cm Growth percentile is 47.1 % EFW: 3,292 g  ( 7 lb 4 oz ) Impression: 1. [redacted]w[redacted]d Viable Singleton Intrauterine pregnancy previously established criteria. 2. Growth is 47.1 %ile.  AFI is 17.4 cm. Gweneth Dimitri, RT The ultrasound images and findings were reviewed by me and I agree with the above report. A repeat of the fetal heart rate revealed a heart rate of 146 bpm. Prentice Docker, MD, [place], [facility] 07/18/2019 2:42 PM       Assessment   23 y.o. G2P1001 at [redacted]w[redacted]d by  07/27/2019, by Ultrasound presenting for routine prenatal visit  Plan   pregnancy2 Problems (from 10/26/18 to present)    Problem Noted Resolved   Supervision of low-risk pregnancy, unspecified trimester 12/16/2018 by Rexene Agent, CNM No   Overview Addendum 06/17/2019 11:50 AM by Homero Fellers, MD    [facility]inic [facility] Prenatal Labs  Dating Ultrasound at 8w 1d Blood type: A/Positive/-- (03/12 1022)   Genetic Screen Declines Antibody:Negative (03/12 1022)  Anatomic [place] Complete 03/10/2019 Rubella: 4.14 (03/12 1022) Varicella: Non-immune  GTT Early: N/A           Third trimester: 76 RPR: Non Reactive (03/12 1022)   Rhogam N/A HBsAg: Negative (03/12 1022)   TDaP vaccine  05/19/2019                      Flu Shot: 06/03/2019 HIV: Non Reactive (03/12 1022)   Baby Food Formula  GBS:   Contraception  Pap: 01/03/2018, NILM  CBB     CS/VBAC    Support Person                  Term labor symptoms and general obstetric precautions including but not limited to vaginal bleeding, contractions, leaking of fluid and fetal  movement were reviewed in detail with the patient. Please refer to After Visit Summary for other counseling recommendations.   Return in 2 days (on 07/20/2019) for IOL at 0800.  Thomasene Mohair, MD, [facility], [facility] 07/18/2019 5:32 PM

## 2019-07-20 ENCOUNTER — Inpatient Hospital Stay
Admission: RE | Admit: 2019-07-20 | Discharge: 2019-07-23 | DRG: 807 | Disposition: A | Discharge status: Home / Self Care | Payer: 59 | Attending: Advanced Practice Midwife | Admitting: Advanced Practice Midwife

## 2019-07-20 ENCOUNTER — Other Ambulatory Visit: Payer: Self-pay

## 2019-07-20 DIAGNOSIS — O43123 Velamentous insertion of umbilical cord, third trimester: Secondary | ICD-10-CM | POA: Diagnosis present

## 2019-07-20 DIAGNOSIS — Z3A39 39 weeks gestation of pregnancy: Secondary | ICD-10-CM | POA: Diagnosis not present

## 2019-07-20 DIAGNOSIS — Z3490 Encounter for supervision of normal pregnancy, unspecified, unspecified trimester: Secondary | ICD-10-CM | POA: Diagnosis present

## 2019-07-20 DIAGNOSIS — Z20828 Contact with and (suspected) exposure to other viral communicable diseases: Secondary | ICD-10-CM | POA: Diagnosis present

## 2019-07-20 DIAGNOSIS — O26893 Other specified pregnancy related conditions, third trimester: Secondary | ICD-10-CM | POA: Diagnosis present

## 2019-07-20 LAB — CBC
HCT: 35.4 % — ABNORMAL LOW (ref 36.0–46.0)
Hemoglobin: 11.8 g/dL — ABNORMAL LOW (ref 12.0–15.0)
MCH: 29.9 pg (ref 26.0–34.0)
MCHC: 33.3 g/dL (ref 30.0–36.0)
MCV: 89.8 fL (ref 80.0–100.0)
Platelets: 157 10*3/uL (ref 150–400)
RBC: 3.94 MIL/uL (ref 3.87–5.11)
RDW: 13.6 % (ref 11.5–15.5)
WBC: 10.5 10*3/uL (ref 4.0–10.5)
nRBC: 0 % (ref 0.0–0.2)

## 2019-07-20 LAB — TYPE AND SCREEN
ABO/RH(D): A POS
Antibody Screen: NEGATIVE

## 2019-07-20 LAB — SARS CORONAVIRUS 2 BY RT PCR (HOSPITAL ORDER, PERFORMED IN ~~LOC~~ HOSPITAL LAB): SARS Coronavirus 2: NEGATIVE

## 2019-07-20 MED ORDER — MISOPROSTOL 25 MCG QUARTER TABLET
25.0000 ug | ORAL_TABLET | ORAL | Status: DC | PRN
  Administered 2019-07-20 (×2): 25 ug via VAGINAL
  Filled 2019-07-20 (×2): qty 1

## 2019-07-20 MED ORDER — MISOPROSTOL 200 MCG PO TABS
ORAL_TABLET | ORAL | Status: AC
  Filled 2019-07-20: qty 4

## 2019-07-20 MED ORDER — LIDOCAINE HCL (PF) 1 % IJ SOLN: 30.0000 mL | INTRAMUSCULAR | Status: DC | PRN

## 2019-07-20 MED ORDER — LACTATED RINGERS IV SOLN
500.0000 mL | INTRAVENOUS | Status: DC | PRN
  Administered 2019-07-21: 500 mL via INTRAVENOUS

## 2019-07-20 MED ORDER — BUTORPHANOL TARTRATE 1 MG/ML IJ SOLN
1.0000 mg | INTRAMUSCULAR | Status: DC | PRN
  Administered 2019-07-20 – 2019-07-21 (×3): 1 mg via INTRAVENOUS
  Filled 2019-07-20 (×2): qty 1

## 2019-07-20 MED ORDER — LACTATED RINGERS IV SOLN
INTRAVENOUS | Status: DC
  Administered 2019-07-20 – 2019-07-21 (×6): via INTRAVENOUS

## 2019-07-20 MED ORDER — OXYTOCIN 10 UNIT/ML IJ SOLN
INTRAMUSCULAR | Status: AC
  Filled 2019-07-20: qty 2

## 2019-07-20 MED ORDER — SOD CITRATE-CITRIC ACID 500-334 MG/5ML PO SOLN: 30.0000 mL | ORAL | Status: DC | PRN

## 2019-07-20 MED ORDER — OXYTOCIN BOLUS FROM INFUSION
500.0000 mL | Freq: Once | INTRAVENOUS | Status: AC
  Administered 2019-07-21: 500 mL via INTRAVENOUS

## 2019-07-20 MED ORDER — TERBUTALINE SULFATE 1 MG/ML IJ SOLN: 0.2500 mg | Freq: Once | INTRAMUSCULAR | Status: DC | PRN

## 2019-07-20 MED ORDER — BUTORPHANOL TARTRATE 1 MG/ML IJ SOLN
INTRAMUSCULAR | Status: AC
  Administered 2019-07-21: 1 mg via INTRAVENOUS
  Filled 2019-07-20: qty 1

## 2019-07-20 MED ORDER — OXYTOCIN 10 UNIT/ML IJ SOLN: 10.0000 [IU] | Freq: Once | INTRAMUSCULAR | Status: DC

## 2019-07-20 MED ORDER — ONDANSETRON HCL 4 MG/2ML IJ SOLN
4.0000 mg | Freq: Four times a day (QID) | INTRAMUSCULAR | Status: DC | PRN
  Administered 2019-07-21: 4 mg via INTRAVENOUS
  Filled 2019-07-20: qty 2

## 2019-07-20 MED ORDER — AMMONIA AROMATIC IN INHA
RESPIRATORY_TRACT | Status: AC
  Filled 2019-07-20: qty 10

## 2019-07-20 MED ORDER — LIDOCAINE HCL (PF) 1 % IJ SOLN
INTRAMUSCULAR | Status: AC
  Filled 2019-07-20: qty 30

## 2019-07-20 MED ORDER — OXYTOCIN 40 UNITS IN NORMAL SALINE INFUSION - SIMPLE MED
2.5000 [IU]/h | INTRAVENOUS | Status: DC
  Administered 2019-07-21: 2.5 [IU]/h via INTRAVENOUS
  Filled 2019-07-20 (×2): qty 1000

## 2019-07-20 MED ORDER — OXYTOCIN 40 UNITS IN NORMAL SALINE INFUSION - SIMPLE MED
1.0000 m[IU]/min | INTRAVENOUS | Status: DC
  Administered 2019-07-20: 1 m[IU]/min via INTRAVENOUS
  Administered 2019-07-21: 2 m[IU]/min via INTRAVENOUS
  Administered 2019-07-21: 12 m[IU]/min via INTRAVENOUS

## 2019-07-20 NOTE — Progress Notes (Signed)
   07/20/19 1740  Clinical Encounter Type  Visited With Patient and family together  Visit Type Initial  Referral From Nurse  Consult/Referral To Chaplain  Spiritual Encounters  Spiritual Needs Other (Comment)  Pt and significant other were in room upon arrival. [facility] introduced self and built rapport. [place] asked if family were interested in [employer]. Pt was still interested in information. [place] completed AD education but informed them of challenges of completing document. A follow up visit to complete form is appreciated.

## 2019-07-20 NOTE — H&P (Signed)
Obstetrics Admission History & Physical   Encounter for planned induction of labor  HPI:  23 y.o. G2P1001 @ [redacted]w[redacted]d (07/27/2019, by Ultrasound). Admitted on 07/20/2019:   Patient Active Problem List   Diagnosis Date Noted  . Encounter for elective induction of labor 07/20/2019  . Indication for care in labor and delivery, antepartum 06/16/2019  . Supervision of low-risk pregnancy, unspecified trimester 12/16/2018    Presents for an elective induction of labor at term. She is not reporting contractions, loss of fluid, or vaginal bleeding. Baby is moving well. Has some slight swelling in her right ankle as she has twisted it several times over the past couple of weeks. No other complaints.  Prenatal care at: at [facility] Pregnancy complicated by none.  ROS: A review of systems was performed and negative, except as stated in the above HPI.  PMHx:  Past Medical History:  Diagnosis Date  . Kidney stone   . Renal disorder    PSHx:  Past Surgical History:  Procedure Laterality Date  . NO PAST SURGERIES     Medications:  Medications Prior to Admission  Medication Sig Dispense Refill Last Dose  . Prenatal Vit-Fe Fumarate-FA (MULTIVITAMIN-PRENATAL) 27-0.8 MG TABS tablet Take 1 tablet by mouth daily at 12 noon.   07/19/2019 at Unknown time   Allergies: has No Known Allergies.   OBHx:  OB History  Gravida Para Term Preterm AB Living  2 1 1     1   SAB TAB Ectopic Multiple Live Births        0 1    # Outcome Date GA Lbr Len/2nd Weight Sex Delivery Anes PTL Lv  2 Current           1 Term 05/14/15 [redacted]w[redacted]d 06:10 / 01:00 2930 g F Vag-Spont EPI  LIV   FHx: Negative/unremarkable except as detailed in HPI.Marland KitchenNo family history of birth defects.  Soc Hx: Never smoker, Alcohol: none and Recreational drug use: none  Objective:   Vitals:   07/20/19 0800  BP: 133/76  Pulse: 84  Resp: 17   Constitutional: Well nourished, well developed female in no acute distress.  HEENT: normal Skin: Warm  and dry.  Cardiovascular: Regular rate and rhythm.   Extremity: trace to 1+ bilateral pedal edema Respiratory: Clear to auscultation bilaterally. Normal respiratory effort Abdomen: gravid, non-tender Neuro: Cranial nerves grossly intact Psych: Alert and Oriented x3. No memory deficits. Normal mood and affect.  MS: normal gait, normal bilateral lower extremity ROM/strength/stability.  Cervix: 0.5/50/-3 (RN exam)  EFM:FHR: 145 bpm, variability: moderate,  accelerations:  Present,  decelerations:  Absent Toco: some uterine irritability   Perinatal info:  Blood type: A positive Rubella - Immune Varicella - Not immune TDaP Given during third trimester of this pregnancy on 05/19/2019 RPR NR / HIV Neg/ HBsAg Neg   Assessment & Plan:   23 y.o. G2P1001 @ [redacted]w[redacted]d, Admitted on 07/20/2019 for an elective induction of labor.  Start induction with Cytotec 25 mcg.    Observe for cervical change, Fetal Wellbeing Reassuring, Epidural when ready, AROM when Appropriate and GBS status negative, treat as needed  Avel Sensor, [facility], [facility] 07/20/2019  10:32 AM

## 2019-07-21 ENCOUNTER — Inpatient Hospital Stay: Payer: 59 | Admitting: Anesthesiology

## 2019-07-21 ENCOUNTER — Encounter: Payer: Self-pay | Admitting: *Deleted

## 2019-07-21 DIAGNOSIS — Z3A39 39 weeks gestation of pregnancy: Secondary | ICD-10-CM

## 2019-07-21 LAB — RPR: RPR Ser Ql: NONREACTIVE

## 2019-07-21 MED ORDER — EPHEDRINE 5 MG/ML INJ: 10.0000 mg | INTRAVENOUS | Status: DC | PRN

## 2019-07-21 MED ORDER — SIMETHICONE 80 MG PO CHEW: 80.0000 mg | CHEWABLE_TABLET | ORAL | Status: DC | PRN

## 2019-07-21 MED ORDER — ACETAMINOPHEN 325 MG PO TABS: 650.0000 mg | ORAL_TABLET | ORAL | Status: DC | PRN

## 2019-07-21 MED ORDER — FENTANYL 2.5 MCG/ML W/ROPIVACAINE 0.15% IN NS 100 ML EPIDURAL (ARMC)
12.0000 mL/h | EPIDURAL | Status: DC
  Filled 2019-07-21: qty 100

## 2019-07-21 MED ORDER — BENZOCAINE-MENTHOL 20-0.5 % EX AERO
1.0000 "application " | INHALATION_SPRAY | CUTANEOUS | Status: DC | PRN
  Administered 2019-07-22: 1 via TOPICAL
  Filled 2019-07-21 (×2): qty 56

## 2019-07-21 MED ORDER — LACTATED RINGERS IV SOLN
500.0000 mL | Freq: Once | INTRAVENOUS | Status: AC
  Administered 2019-07-21: 500 mL via INTRAVENOUS

## 2019-07-21 MED ORDER — DIPHENHYDRAMINE HCL 25 MG PO CAPS: 25.0000 mg | ORAL_CAPSULE | Freq: Four times a day (QID) | ORAL | Status: DC | PRN

## 2019-07-21 MED ORDER — DIPHENHYDRAMINE HCL 50 MG/ML IJ SOLN: 12.5000 mg | INTRAMUSCULAR | Status: DC | PRN

## 2019-07-21 MED ORDER — LIDOCAINE HCL (PF) 1 % IJ SOLN
INTRAMUSCULAR | Status: DC | PRN
  Administered 2019-07-21: 3 mL via SUBCUTANEOUS

## 2019-07-21 MED ORDER — PHENYLEPHRINE 40 MCG/ML (10ML) SYRINGE FOR IV PUSH (FOR BLOOD PRESSURE SUPPORT): 80.0000 ug | PREFILLED_SYRINGE | INTRAVENOUS | Status: DC | PRN

## 2019-07-21 MED ORDER — SENNOSIDES-DOCUSATE SODIUM 8.6-50 MG PO TABS
2.0000 | ORAL_TABLET | ORAL | Status: DC
  Administered 2019-07-22: 2 via ORAL
  Filled 2019-07-21: qty 2

## 2019-07-21 MED ORDER — BUPIVACAINE HCL (PF) 0.25 % IJ SOLN
INTRAMUSCULAR | Status: DC | PRN
  Administered 2019-07-21 (×2): 4 mL via EPIDURAL

## 2019-07-21 MED ORDER — PRENATAL MULTIVITAMIN CH
1.0000 | ORAL_TABLET | Freq: Every day | ORAL | Status: DC
  Administered 2019-07-22: 1 via ORAL
  Filled 2019-07-21: qty 1

## 2019-07-21 MED ORDER — FENTANYL 2.5 MCG/ML W/ROPIVACAINE 0.15% IN NS 100 ML EPIDURAL (ARMC)
EPIDURAL | Status: DC | PRN
  Administered 2019-07-21: 12 mL/h via EPIDURAL

## 2019-07-21 MED ORDER — ONDANSETRON HCL 4 MG PO TABS: 4.0000 mg | ORAL_TABLET | ORAL | Status: DC | PRN

## 2019-07-21 MED ORDER — IBUPROFEN 600 MG PO TABS
600.0000 mg | ORAL_TABLET | Freq: Four times a day (QID) | ORAL | Status: DC
  Administered 2019-07-22 – 2019-07-23 (×6): 600 mg via ORAL
  Filled 2019-07-21 (×6): qty 1

## 2019-07-21 MED ORDER — LIDOCAINE-EPINEPHRINE (PF) 1.5 %-1:200000 IJ SOLN
INTRAMUSCULAR | Status: DC | PRN
  Administered 2019-07-21: 3 mL via EPIDURAL

## 2019-07-21 MED ORDER — COCONUT OIL OIL: 1.0000 "application " | TOPICAL_OIL | Status: DC | PRN

## 2019-07-21 MED ORDER — DIBUCAINE (PERIANAL) 1 % EX OINT: 1.0000 "application " | TOPICAL_OINTMENT | CUTANEOUS | Status: DC | PRN

## 2019-07-21 MED ORDER — WITCH HAZEL-GLYCERIN EX PADS
1.0000 "application " | MEDICATED_PAD | CUTANEOUS | Status: DC | PRN
  Administered 2019-07-22: 1 via TOPICAL
  Filled 2019-07-21 (×2): qty 100

## 2019-07-21 MED ORDER — ONDANSETRON HCL 4 MG/2ML IJ SOLN: 4.0000 mg | INTRAMUSCULAR | Status: DC | PRN

## 2019-07-21 MED ORDER — TETANUS-DIPHTH-ACELL PERTUSSIS 5-2.5-18.5 LF-MCG/0.5 IM SUSP: 0.5000 mL | Freq: Once | INTRAMUSCULAR | Status: DC

## 2019-07-21 NOTE — Discharge Summary (Signed)
OB Discharge Summary     Patient Name: Alexis Ward DOB: 1996-04-18 MRN: [id number]  Date of admission: 07/20/2019 Delivering provider: Rod Can, CNM  Date of Delivery: 07/21/2019  Date of discharge: 07/23/2019  Admitting diagnosis: Labor Intrauterine pregnancy: [redacted]w[redacted]d     Secondary diagnosis: None     Discharge diagnosis: Term Pregnancy Delivered                                                                                                Post partum procedures:varivax, Depo Provera  Augmentation: AROM, Pitocin and Cytotec  Complications: None  Hospital course:  Induction of Labor With Vaginal Delivery   23 y.o. yo [id number] at [redacted]w[redacted]d was admitted to the hospital 07/20/2019 for induction of labor.  Indication for induction: elective.  Patient had an uncomplicated labor course as follows: Membrane Rupture Time/Date: 11:06 AM ,07/21/2019   Patient had delivery of viable female 8:41 PM, 07/21/19 Details of delivery can be found in separate delivery note.    Patient had a routine postpartum course.   Patient is discharged home 07/23/19.  Physical exam  Vitals:   07/22/19 0827 07/22/19 1138 07/22/19 2355 07/23/19 0856  BP: 119/86 117/74 126/74 123/81  Pulse: 79 73 76 67  Resp: 18 20 20 18   Temp: 98 F (36.7 C) 97.8 F (36.6 C) 98.2 F (36.8 C) 98.2 F (36.8 C)  TempSrc: Oral Oral Oral Oral  SpO2: 100%  100% 99%  Weight:      Height:       General: alert, cooperative and no distress Lochia: appropriate Uterine Fundus: firm/ U-1/ML/NT Perineum/Labia: lacerations healing well DVT Evaluation: No evidence of DVT seen on physical exam.  Labs: Lab Results  Component Value Date   WBC 18.2 (H) 07/22/2019   HGB 11.1 (L) 07/22/2019   HCT 33.7 (L) 07/22/2019   MCV 90.3 07/22/2019   PLT 150 07/22/2019    Discharge instruction: per After Visit Summary.  Medications:  Allergies as of 07/23/2019   No Known Allergies     Medication List    TAKE these medications    acetaminophen 325 MG tablet Commonly known as: Tylenol Take 2 tablets (650 mg total) by mouth every 4 (four) hours as needed for mild pain or moderate pain (for pain scale < 4).   ibuprofen 600 MG tablet Commonly known as: ADVIL Take 1 tablet (600 mg total) by mouth every 6 (six) hours.   multivitamin-prenatal 27-0.8 MG Tabs tablet Take 1 tablet by mouth daily at 12 noon.       Diet: routine diet  Activity: Advance as tolerated. Pelvic rest for 6 weeks.   Outpatient follow up: Follow-up Information    Rod Can, CNM. Schedule an appointment as soon as possible for a visit in 6 week(s).   Specialty: Obstetrics Why: postpartum follow up visit Contact information: [address] [place] [address] [phone]             Postpartum contraception: Depo Provera Rhogam Given postpartum: NA Rubella vaccine given postpartum: Immune Varicella vaccine given postpartum: yes TDaP given antepartum or postpartum: given antepartum  Newborn Data: Live born female Antony Madura Birth Weight: 3170 g, 6 pounds 15.8 ounces APGAR: 8, 9  Newborn Delivery   Birth date/time: 07/21/2019 20:41:00 Delivery type: Vaginal, Spontaneous       Baby Feeding: formula  Disposition:home with mother  SIGNED:  Farrel Conners, CNM 07/23/2019 9:50 AM

## 2019-07-21 NOTE — Anesthesia Preprocedure Evaluation (Signed)
Anesthesia Evaluation  Patient identified by MRN, date of birth, ID band Patient awake    Reviewed: Allergy & Precautions, H&P , NPO status , Patient's Chart, lab work & pertinent test results  History of Anesthesia Complications Negative for: history of anesthetic complications  Airway Mallampati: II       Dental no notable dental hx.    Pulmonary           Cardiovascular      Neuro/Psych    GI/Hepatic   Endo/Other    Renal/GU Renal disease     Musculoskeletal   Abdominal   Peds  Hematology   Anesthesia Other Findings   Reproductive/Obstetrics (+) Pregnancy                             Anesthesia Physical Anesthesia Plan  ASA: II  Anesthesia Plan: Epidural   Post-op Pain Management:    Induction:   PONV Risk Score and Plan:   Airway Management Planned:   Additional Equipment:   Intra-op Plan:   Post-operative Plan:   Informed Consent:   Plan Discussed with: Anesthesiologist  Anesthesia Plan Comments:         Anesthesia Quick Evaluation

## 2019-07-21 NOTE — Progress Notes (Signed)
Subjective:  Resting in bed, coping well with contractions. Rating pain 6/10. Satisfied with IV pain medication currently.  Objective:  Vitals: Blood pressure 113/69, pulse 61, temperature 98 F (36.7 C), temperature source Oral, resp. rate 16, height 5\' 2"  (1.575 m), weight 81.6 kg, last menstrual period 11/01/2018.  General: NAD Abdomen: gravid, non-tender Cervical Exam:  Dilation: 4 Effacement (%): 70 Cervical Position: Posterior Station: -3 Presentation: Vertex Exam by:: Deeann Saint CNM  FHT: Baseline 135 bpm, moderate variability, accelerations present, decelerations absent Toco: Contraction frequency: 2.5-6 minutes, Duration: 40-90 seconds, Intensity: mild to moderate  Results for orders placed or performed during the hospital encounter of 07/20/19 (from the past 24 hour(s))  SARS Coronavirus 2 by RT PCR (hospital order, performed in [place] hospital lab) Nasopharyngeal Nasopharyngeal Swab     Status: None   Collection Time: 07/20/19  8:28 AM   Specimen: Nasopharyngeal Swab  Result Value Ref Range   SARS Coronavirus 2 NEGATIVE NEGATIVE  CBC     Status: Abnormal   Collection Time: 07/20/19  9:25 AM  Result Value Ref Range   WBC 10.5 4.0 - 10.5 K/uL   RBC 3.94 3.87 - 5.11 MIL/uL   Hemoglobin 11.8 (L) 12.0 - 15.0 g/dL   HCT 35.4 (L) 36.0 - 46.0 %   MCV 89.8 80.0 - 100.0 fL   MCH 29.9 26.0 - 34.0 pg   MCHC 33.3 30.0 - 36.0 g/dL   RDW 13.6 11.5 - 15.5 %   Platelets 157 150 - 400 K/uL   nRBC 0.0 0.0 - 0.2 %  Type and screen     Status: None   Collection Time: 07/20/19  9:25 AM  Result Value Ref Range   ABO/RH(D) A POS    Antibody Screen NEG    Sample Expiration      07/23/2019,2359 Performed at [facility], [address], [place], [place] [address]     Assessment:   23 y.o. G2P1001 [redacted]w[redacted]d here for an elective induction of labor.  Plan:  1) Labor - continue to titrate Pitocin  2) Fetus - Category I tracing  3) IV pain medicine as needed, epidural  when ready.  Avel Sensor, CNM 07/21/2019  5:00 AM

## 2019-07-21 NOTE — Progress Notes (Signed)
  Labor Progress Note   23 y.o. G2P1001 @ [redacted]w[redacted]d , admitted for  Pregnancy, Labor Management.   Subjective:  Comfortable with epidural  Objective:  BP (!) 108/57 (BP Location: Right Arm)   Pulse 61   Temp 98.3 F (36.8 C) (Oral)   Resp 16   Ht 5\' 2"  (1.575 m)   Wt 81.6 kg   LMP 11/01/2018 (Approximate)   SpO2 100%   BMI 32.92 kg/m  Abd: gravid, ND, FHT present, mild tenderness on exam Extr: no edema SVE: CERVIX: 8.5 cm dilated, 90 effaced, 0 station  EFM: FHR: 125 bpm, variability: moderate,  accelerations:  Present,  decelerations:  Absent, a few variables seen earlier- some early and some late, good variability throughout and good return to baseline, currently category I Toco: Frequency: Every 2-4 minutes Labs: I have reviewed the patient's lab results.   Assessment & Plan:  G2P1001 @ 101w1d, admitted for  Pregnancy and Labor/Delivery Management  1. Pain management: epidural. 2. FWB: FHT category I.  3. ID: GBS negative 4. Labor management: continue pitocin, expect second stage soon  All discussed with patient, see orders   Rod Can, [facility] 07/21/2019  6:50 PM

## 2019-07-21 NOTE — Anesthesia Procedure Notes (Signed)
Epidural Patient location during procedure: OB Start time: 07/21/2019 3:33 PM End time: 07/21/2019 3:48 PM  Staffing Anesthesiologist: Martha Clan, MD Resident/CRNA: Aline Brochure, CRNA Performed: resident/CRNA   Preanesthetic Checklist Completed: patient identified, site marked, surgical consent, pre-op evaluation, timeout performed, IV checked, risks and benefits discussed and monitors and equipment checked  Epidural Patient position: sitting Prep: Betadine Patient monitoring: heart rate, continuous pulse ox and blood pressure Approach: midline Location: L3-L4 Injection technique: LOR air  Needle:  Needle type: Tuohy  Needle gauge: 18 G Needle length: 9 cm and 9 Needle insertion depth: 8 cm Catheter type: closed end flexible Catheter size: 20 Guage Catheter at skin depth: 12 cm Test dose: negative and 1.5% lidocaine with Epi 1:200 K  Assessment Events: blood not aspirated, injection not painful, no injection resistance, negative IV test and no paresthesia  Additional Notes   Patient tolerated the insertion well without complications.Reason for block:procedure for pain

## 2019-07-21 NOTE — Progress Notes (Signed)
  Labor Progress Note   23 y.o. G2P1001 @ [redacted]w[redacted]d , admitted for  Pregnancy, Labor Management.   Subjective:  Beginning to feel strong contractions. She requests epidural.  Objective:  BP 125/77 (BP Location: Right Arm)   Pulse 81   Temp 98.5 F (36.9 C) (Oral)   Resp 16   Ht 5\' 2"  (1.575 m)   Wt 81.6 kg   LMP 11/01/2018 (Approximate)   BMI 32.92 kg/m  Abd: gravid, ND, FHT present, mild tenderness on exam Extr: no edema SVE: CERVIX: 5 cm dilated, 80 effaced, -1 station  EFM: FHR: 140 bpm, variability: moderate,  accelerations:  Present,  decelerations:  Absent Toco: Frequency: difficult to trace/patient feels contractions every few minutes Labs: I have reviewed the patient's lab results.   Assessment & Plan:  G2P1001 @ [redacted]w[redacted]d, admitted for  Pregnancy and Labor/Delivery Management  1. Pain management: IV analgesia. 2. FWB: FHT category I.  3. ID: GBS negative 4. Labor management: continue pitocin, epidural  All discussed with patient, see orders  Rod Can, [facility] 07/21/2019  3:08 PM

## 2019-07-21 NOTE — Progress Notes (Signed)
  Labor Progress Note   23 y.o. G2P1001 @ [redacted]w[redacted]d , admitted for  Pregnancy, Labor Management.   Subjective:  Feeling baby move well. Patient has had a pitocin break, breakfast and a shower and is ready for the next phase of induction.   Objective:  BP 125/77 (BP Location: Right Arm)   Pulse 81   Temp 98.5 F (36.9 C) (Oral)   Resp 16   Ht 5\' 2"  (1.575 m)   Wt 81.6 kg   LMP 11/01/2018 (Approximate)   BMI 32.92 kg/m  Abd: gravid, ND, FHT present, mild tenderness on exam Extr: no edema SVE: CERVIX: 4.5 cm dilated, 70 effaced, -2 station, patient had low tolerance for exam AROM clear  EFM: FHR: 130 bpm, variability: moderate,  accelerations:  Present,  decelerations:  Absent Toco: None- getting back on monitors Labs: I have reviewed the patient's lab results.   Assessment & Plan:  G2P1001 @ [redacted]w[redacted]d, admitted for  Pregnancy and Labor/Delivery Management  1. Pain management: none. 2. FWB: FHT category I 3. ID: GBS negative 4. Labor management: restart pitocin  All discussed with patient, see orders   Rod Can, [facility] 07/21/2019  11:28 AM

## 2019-07-22 ENCOUNTER — Encounter: Payer: Self-pay | Admitting: Certified Nurse Midwife

## 2019-07-22 LAB — CBC
HCT: 33.7 % — ABNORMAL LOW (ref 36.0–46.0)
Hemoglobin: 11.1 g/dL — ABNORMAL LOW (ref 12.0–15.0)
MCH: 29.8 pg (ref 26.0–34.0)
MCHC: 32.9 g/dL (ref 30.0–36.0)
MCV: 90.3 fL (ref 80.0–100.0)
Platelets: 150 10*3/uL (ref 150–400)
RBC: 3.73 MIL/uL — ABNORMAL LOW (ref 3.87–5.11)
RDW: 13.5 % (ref 11.5–15.5)
WBC: 18.2 10*3/uL — ABNORMAL HIGH (ref 4.0–10.5)
nRBC: 0 % (ref 0.0–0.2)

## 2019-07-22 NOTE — Progress Notes (Signed)
Post Partum Day 1 Subjective: no complaints, up ad lib, voiding and tolerating PO Baby bottle feeding. Has been spitting up mucous Objective: Blood pressure 119/86, pulse 79, temperature 98 F (36.7 C), temperature source Oral, resp. rate 18, height 5\' 2"  (1.575 m), weight 81.6 kg, last menstrual period 11/01/2018, SpO2 100 %, unknown if currently breastfeeding.  Physical Exam:  General: alert, cooperative and no distress Lochia: appropriate Uterine Fundus: firm/ U/ML/ NT Vulvar lacs not visualized DVT Evaluation: No evidence of DVT seen on physical exam, trace pitting edema  Recent Labs    07/20/19 0925 07/22/19 0614  HGB 11.8* 11.1*  HCT 35.4* 33.7*  WBC 10.5 18.2*  PLT 157 150    Assessment/Plan: Stable PPD #1 (12 hours pp)  Continue postpartum care Bottle/Depo TDAP UTD A POS/ RI/ VNI-offer Varivax at discharge Probable discharge tomorrow   LOS: 2 days   Dalia Heading 07/22/2019, 9:19 AM

## 2019-07-22 NOTE — Anesthesia Postprocedure Evaluation (Signed)
Anesthesia Post Note  Patient: Alexis Ward  Procedure(s) Performed: AN AD HOC LABOR EPIDURAL  Patient location during evaluation: Mother Baby Anesthesia Type: Epidural Level of consciousness: awake, awake and alert, oriented and patient cooperative Pain management: pain level controlled Respiratory status: spontaneous breathing, nonlabored ventilation and respiratory function stable Cardiovascular status: stable Postop Assessment: no headache, no backache, patient able to bend at knees, adequate PO intake and able to ambulate Anesthetic complications: no     Last Vitals:  Vitals:   07/22/19 0030 07/22/19 0401  BP: 113/62 (!) 101/50  Pulse: 69 72  Resp: 18 18  Temp: 36.8 C 36.7 C  SpO2:  99%    Last Pain:  Vitals:   07/22/19 0401  TempSrc: Oral  PainSc:                  [name]

## 2019-07-23 MED ORDER — ACETAMINOPHEN 325 MG PO TABS: 650.0000 mg | ORAL_TABLET | ORAL | Status: DC | PRN

## 2019-07-23 MED ORDER — IBUPROFEN 600 MG PO TABS: 600.0000 mg | ORAL_TABLET | Freq: Four times a day (QID) | ORAL | 0 refills | Status: DC

## 2019-07-23 MED ORDER — MEDROXYPROGESTERONE ACETATE 150 MG/ML IM SUSP
150.0000 mg | Freq: Once | INTRAMUSCULAR | Status: AC
  Administered 2019-07-23: 11:00:00 150 mg via INTRAMUSCULAR
  Filled 2019-07-23: qty 1

## 2019-07-23 MED ORDER — VARICELLA VIRUS VACCINE LIVE 1350 PFU/0.5ML IJ SUSR
0.5000 mL | Freq: Once | INTRAMUSCULAR | Status: AC
  Administered 2019-07-23: 0.5 mL via SUBCUTANEOUS
  Filled 2019-07-23 (×2): qty 0.5

## 2019-07-23 NOTE — Progress Notes (Signed)
Pt discharged with infant. Discharge instructions, prescriptions, and follow up appointments given to and reviewed with patient. Pt verbalized understanding. Escorted out by staff. 

## 2019-09-01 ENCOUNTER — Other Ambulatory Visit: Payer: Self-pay

## 2019-09-01 ENCOUNTER — Ambulatory Visit (INDEPENDENT_AMBULATORY_CARE_PROVIDER_SITE_OTHER): Payer: BC Managed Care – PPO | Admitting: Advanced Practice Midwife

## 2019-09-01 ENCOUNTER — Encounter: Payer: Self-pay | Admitting: Advanced Practice Midwife

## 2019-09-01 DIAGNOSIS — Z1389 Encounter for screening for other disorder: Secondary | ICD-10-CM | POA: Diagnosis not present

## 2019-09-01 DIAGNOSIS — Z3042 Encounter for surveillance of injectable contraceptive: Secondary | ICD-10-CM

## 2019-09-01 MED ORDER — MEDROXYPROGESTERONE ACETATE 150 MG/ML IM SUSP: 150.0000 mg | INTRAMUSCULAR | 3 refills | Status: DC

## 2019-09-01 NOTE — Progress Notes (Signed)
Postpartum Visit  Chief Complaint:  Chief Complaint  Patient presents with  . Postpartum Care    History of Present Illness: Patient is a 23 y.o. [id number] presents for postpartum visit.  Review the Delivery Report for details.  Date of delivery: 07/21/2019 Type of delivery: Vaginal delivery - Vacuum or forceps assisted  no Episiotomy No.  Laceration: bilateral labial lacerations repaired with 4.0 vicryl  Pregnancy or labor problems:  no Any problems since the delivery:  no  Newborn Details:  SINGLETON :  1. BabyGender female. Birth weight: 7 pounds Maternal Details:  Breast or formula feeding: formula feeding Intercourse: No  Contraception after delivery: Yes Depo- 1st injection at hospital Any bowel or bladder issues: No  Post partum depression/anxiety noted:  no Edinburgh Post-Partum Depression Score: 2 Date of last PAP: 1 year ago  no abnormalities   Review of Systems: Review of Systems  Constitutional: Negative.   HENT: Negative.   Eyes: Negative.   Respiratory: Negative.   Cardiovascular: Negative.   Gastrointestinal: Negative.   Genitourinary: Negative.   Musculoskeletal: Negative.   Skin: Negative.   Neurological: Negative.   Endo/Heme/Allergies: Negative.   Psychiatric/Behavioral: Negative.     Past Medical History:  Past Medical History:  Diagnosis Date  . Kidney stone   . Renal disorder     Past Surgical History:  Past Surgical History:  Procedure Laterality Date  . NO PAST SURGERIES      Family History:  History reviewed. No pertinent family history.  Social History:  Social History   Socioeconomic History  . Marital status: Married    Spouse name: Not on file  . Number of children: Not on file  . Years of education: Not on file  . Highest education level: Not on file  Occupational History  . Not on file  Social Needs  . Financial resource strain: Not on file  . Food insecurity    Worry: Not on file    Inability: Not on file   . Transportation needs    Medical: Not on file    Non-medical: Not on file  Tobacco Use  . Smoking status: Never Smoker  . Smokeless tobacco: Never Used  Substance and Sexual Activity  . Alcohol use: No  . Drug use: No  . Sexual activity: Yes    Birth control/protection: Injection  Lifestyle  . Physical activity    Days per week: Not on file    Minutes per session: Not on file  . Stress: Not on file  Relationships  . Social Musician on phone: Not on file    Gets together: Not on file    Attends religious service: Not on file    Active member of club or organization: Not on file    Attends meetings of clubs or organizations: Not on file    Relationship status: Not on file  . Intimate partner violence    Fear of current or ex partner: Not on file    Emotionally abused: Not on file    Physically abused: Not on file    Forced sexual activity: Not on file  Other Topics Concern  . Not on file  Social History Narrative  . Not on file    Allergies:  No Known Allergies  Medications: Prior to Admission medications   Medication Sig Start Date End Date Taking? Authorizing Provider  acetaminophen (TYLENOL) 325 MG tablet Take 2 tablets (650 mg total) by mouth every 4 (four)  hours as needed for mild pain or moderate pain (for pain scale < 4). 07/23/19   Dalia Heading, CNM  ibuprofen (ADVIL) 600 MG tablet Take 1 tablet (600 mg total) by mouth every 6 (six) hours. 07/23/19   Dalia Heading, CNM  medroxyPROGESTERone (DEPO-PROVERA) 150 MG/ML injection Inject 1 mL (150 mg total) into the muscle every 3 (three) months. 09/01/19   Rod Can, CNM  Prenatal Vit-Fe Fumarate-FA (MULTIVITAMIN-PRENATAL) 27-0.8 MG TABS tablet Take 1 tablet by mouth daily at 12 noon.    [provider]    Physical Exam Blood pressure 120/80, height 5\' 2"  (1.575 m), weight 175 lb (79.4 kg), last menstrual period 11/01/2018    General: NAD HEENT: normocephalic, anicteric  Pulmonary: No increased work of breathing Abdomen: NABS, soft, non-tender, non-distended.  Umbilicus without lesions.  No hepatomegaly, splenomegaly or masses palpable. No evidence of hernia. Genitourinary:  External: Normal external female genitalia.  Normal urethral meatus, normal  Bartholin's and Skene's glands.    Vagina: Normal vaginal mucosa, no evidence of prolapse.    Cervix: Grossly normal in appearance, no bleeding, no CMT  Uterus: Non-enlarged, mobile, normal contour.    Adnexa: ovaries non-enlarged, no adnexal masses  Rectal: deferred Extremities: no edema, erythema, or tenderness Neurologic: Grossly intact Psychiatric: mood appropriate, affect full    Edinburgh Postnatal Depression Scale - 09/01/19 1000      Edinburgh Postnatal Depression Scale:  In the Past 7 Days   I have been able to laugh and see the funny side of things.  0    I have looked forward with enjoyment to things.  0    I have blamed myself unnecessarily when things went wrong.  1    I have been anxious or worried for no good reason.  0    I have felt scared or panicky for no good reason.  0    Things have been getting on top of me.  1    I have been so unhappy that I have had difficulty sleeping.  0    I have felt sad or miserable.  0    I have been so unhappy that I have been crying.  0    The thought of harming myself has occurred to me.  0    Edinburgh Postnatal Depression Scale Total  2       Assessment: 23 y.o. [id number] presenting for 6 week postpartum visit  Plan: Problem List Items Addressed This Visit    None    Visit Diagnoses    6 weeks postpartum follow-up    -  Primary   Relevant Medications   medroxyPROGESTERone (DEPO-PROVERA) 150 MG/ML injection   Encounter for surveillance of injectable contraceptive       Relevant Medications   medroxyPROGESTERone (DEPO-PROVERA) 150 MG/ML injection       1) Contraception - Education given regarding options for contraception, as well as  compatibility with breast feeding if applicable.  Patient plans on Depo-Provera injections for contraception.  2)  Pap - ASCCP guidelines and rational discussed.  ASCCP guidelines and rational discussed.  Patient opts for every 3 years screening interval  3) Patient underwent screening for postpartum depression with no signs of depression  4) Return in 1 year for annual exam and as needed   Rod Can, [facility] 09/01/2019, 10:02 AM

## 2019-09-01 NOTE — Addendum Note (Signed)
Addended by: Rod Can on: 09/01/2019 11:02 AM   Modules accepted: Level of Service

## 2019-10-08 ENCOUNTER — Ambulatory Visit: Payer: BC Managed Care – PPO

## 2019-10-08 ENCOUNTER — Ambulatory Visit (INDEPENDENT_AMBULATORY_CARE_PROVIDER_SITE_OTHER): Payer: BC Managed Care – PPO

## 2019-10-08 ENCOUNTER — Other Ambulatory Visit: Payer: Self-pay

## 2019-10-08 ENCOUNTER — Ambulatory Visit: Payer: Self-pay

## 2019-10-08 DIAGNOSIS — Z3042 Encounter for surveillance of injectable contraceptive: Secondary | ICD-10-CM | POA: Diagnosis not present

## 2019-10-08 MED ORDER — MEDROXYPROGESTERONE ACETATE 150 MG/ML IM SUSP
150.0000 mg | Freq: Once | INTRAMUSCULAR | Status: AC
  Administered 2019-10-08: 16:00:00 150 mg via INTRAMUSCULAR

## 2019-10-08 NOTE — Progress Notes (Signed)
Pt here for depo which was given IM right glut.  NDC# 0548-5701-00 

## 2019-12-31 ENCOUNTER — Other Ambulatory Visit: Payer: Self-pay

## 2019-12-31 ENCOUNTER — Ambulatory Visit (INDEPENDENT_AMBULATORY_CARE_PROVIDER_SITE_OTHER): Payer: BC Managed Care – PPO

## 2019-12-31 DIAGNOSIS — Z3042 Encounter for surveillance of injectable contraceptive: Secondary | ICD-10-CM

## 2019-12-31 MED ORDER — MEDROXYPROGESTERONE ACETATE 150 MG/ML IM SUSP: 150.0000 mg | Freq: Once | INTRAMUSCULAR | Status: DC

## 2019-12-31 MED ORDER — MEDROXYPROGESTERONE ACETATE 150 MG/ML IM SUSP
150.0000 mg | Freq: Once | INTRAMUSCULAR | Status: AC
  Administered 2019-12-31: 150 mg via INTRAMUSCULAR

## 2020-03-24 ENCOUNTER — Ambulatory Visit (INDEPENDENT_AMBULATORY_CARE_PROVIDER_SITE_OTHER): Payer: BC Managed Care – PPO

## 2020-03-24 ENCOUNTER — Other Ambulatory Visit: Payer: Self-pay

## 2020-03-24 DIAGNOSIS — Z3042 Encounter for surveillance of injectable contraceptive: Secondary | ICD-10-CM

## 2020-03-24 MED ORDER — MEDROXYPROGESTERONE ACETATE 150 MG/ML IM SUSP
150.0000 mg | Freq: Once | INTRAMUSCULAR | Status: AC
  Administered 2020-03-24: 150 mg via INTRAMUSCULAR

## 2020-03-24 NOTE — Progress Notes (Signed)
Pt here for depo inj which was given IM right glut.  NDC# 9470-9628-36

## 2020-06-04 ENCOUNTER — Ambulatory Visit
Admission: EM | Admit: 2020-06-04 | Discharge: 2020-06-04 | Disposition: A | Discharge status: Home / Self Care | Payer: BC Managed Care – PPO | Attending: Emergency Medicine | Admitting: Emergency Medicine

## 2020-06-04 ENCOUNTER — Other Ambulatory Visit: Payer: Self-pay

## 2020-06-04 ENCOUNTER — Encounter: Payer: Self-pay | Admitting: Emergency Medicine

## 2020-06-04 DIAGNOSIS — H1032 Unspecified acute conjunctivitis, left eye: Secondary | ICD-10-CM | POA: Diagnosis not present

## 2020-06-04 MED ORDER — POLYMYXIN B-TRIMETHOPRIM 10000-0.1 UNIT/ML-% OP SOLN: 1.0000 [drp] | OPHTHALMIC | 0 refills | Status: AC

## 2020-06-04 NOTE — ED Provider Notes (Signed)
[facility]   [id number] 06/04/20 Arrival Time: 1056  Chief Complaint  Patient presents with  . Eye Problem     SUBJECTIVE:  Alexis Ward is a 24 y.o. female who presented to the urgent care for complaint of left eye irritation redness and drainage for the past 2 days.  Denies a precipitating event, trauma, or close contacts with similar symptoms.  Has tried OTC eye drops without relief.  Reports symptoms were made worse after she used OTC eyedrops yesterday.  Denies similar symptoms in the past.  Denies fever, chills, nausea, vomiting, eye pain, painful eye movements, halos, discharge, itching, vision changes, double vision, FB sensation, periorbital erythema.     Denies contact lens use.    ROS: As per HPI.  All other pertinent ROS negative.     Past Medical History:  Diagnosis Date  . Kidney stone   . Renal disorder    Past Surgical History:  Procedure Laterality Date  . NO PAST SURGERIES     No Known Allergies Current Facility-Administered Medications on File Prior to Encounter  Medication Dose Route Frequency Provider Last Rate Last Admin  . medroxyPROGESTERone (DEPO-PROVERA) injection 150 mg  150 mg Intramuscular Once Nadara Mustard, MD       Current Outpatient Medications on File Prior to Encounter  Medication Sig Dispense Refill  . acetaminophen (TYLENOL) 325 MG tablet Take 2 tablets (650 mg total) by mouth every 4 (four) hours as needed for mild pain or moderate pain (for pain scale < 4).    . ibuprofen (ADVIL) 600 MG tablet Take 1 tablet (600 mg total) by mouth every 6 (six) hours. 30 tablet 0  . medroxyPROGESTERone (DEPO-PROVERA) 150 MG/ML injection Inject 1 mL (150 mg total) into the muscle every 3 (three) months. 1 mL 3  . Prenatal Vit-Fe Fumarate-FA (MULTIVITAMIN-PRENATAL) 27-0.8 MG TABS tablet Take 1 tablet by mouth daily at 12 noon.     Social History   Socioeconomic History  . Marital status: Married    Spouse name: Not on file  . Number  of children: Not on file  . Years of education: Not on file  . Highest education level: Not on file  Occupational History  . Not on file  Tobacco Use  . Smoking status: Never Smoker  . Smokeless tobacco: Never Used  Vaping Use  . Vaping Use: Never used  Substance and Sexual Activity  . Alcohol use: No  . Drug use: No  . Sexual activity: Yes    Birth control/protection: Injection  Other Topics Concern  . Not on file  Social History Narrative  . Not on file   Social Determinants of Health   Financial Resource Strain:   . Difficulty of Paying Living Expenses: Not on file  Food Insecurity:   . Worried About Programme researcher, broadcasting/film/video in the Last Year: Not on file  . Ran Out of Food in the Last Year: Not on file  Transportation Needs:   . Lack of Transportation (Medical): Not on file  . Lack of Transportation (Non-Medical): Not on file  Physical Activity:   . Days of Exercise per Week: Not on file  . Minutes of Exercise per Session: Not on file  Stress:   . Feeling of Stress : Not on file  Social Connections:   . Frequency of Communication with Friends and Family: Not on file  . Frequency of Social Gatherings with Friends and Family: Not on file  . Attends Religious Services: Not  on file  . Active Member of Clubs or Organizations: Not on file  . Attends Banker Meetings: Not on file  . Marital Status: Not on file  Intimate Partner Violence:   . Fear of Current or Ex-Partner: Not on file  . Emotionally Abused: Not on file  . Physically Abused: Not on file  . Sexually Abused: Not on file   No family history on file.  OBJECTIVE:    Visual Acuity  Right Eye Distance:   Left Eye Distance:   Bilateral Distance:    Right Eye Near:   Left Eye Near:    Bilateral Near:      Vitals:   06/04/20 1139  BP: 131/81  Pulse: 85  Resp: 16  Temp: 98.5 F (36.9 C)  TempSrc: Oral  SpO2: 98%    Physical Exam Vitals and nursing note reviewed.  Constitutional:       General: She is not in acute distress.    Appearance: Normal appearance. She is normal weight. She is not ill-appearing, toxic-appearing or diaphoretic.  HENT:     Head: Normocephalic.  Eyes:     General: Lids are normal. Vision grossly intact. Gaze aligned appropriately. No visual field deficit.       Right eye: No foreign body, discharge or hordeolum.        Left eye: Discharge present.No foreign body or hordeolum.     Extraocular Movements: Extraocular movements intact.     Conjunctiva/sclera:     Left eye: Exudate present.  Cardiovascular:     Rate and Rhythm: Normal rate and regular rhythm.     Pulses: Normal pulses.     Heart sounds: Normal heart sounds. No murmur heard.  No friction rub. No gallop.   Pulmonary:     Effort: Pulmonary effort is normal. No respiratory distress.     Breath sounds: Normal breath sounds. No stridor. No wheezing, rhonchi or rales.  Chest:     Chest wall: No tenderness.  Neurological:     Mental Status: She is alert and oriented to person, place, and time.      ASSESSMENT & PLAN:  1. Acute bacterial conjunctivitis of left eye     Meds ordered this encounter  Medications  . trimethoprim-polymyxin b (POLYTRIM) ophthalmic solution    Sig: Place 1 drop into the left eye every 4 (four) hours for 10 days.    Dispense:  10 mL    Refill:  0     Discharge instructions  Use eye drops as prescribed and to completion Dispose of old contacts and wear glasses until you have finished course of antibiotic eye drops Wash pillow cases, wash hands regularly with soap and water, avoid touching your face and eyes, wash door handles, light switches, remotes and other objects you frequently touch Return or follow up with PCP if symptoms persists such as fever, chills, redness, swelling, eye pain, painful eye movements, vision changes, etc...    Reviewed expectations re: course of current medical issues. Questions answered. Outlined signs and symptoms  indicating need for more acute intervention. Patient verbalized understanding. After Visit Summary given.      Note: This document was prepared using Dragon voice recognition software and may include unintentional dictation errors.    Durward Parcel, FNP 06/04/20 1208

## 2020-06-04 NOTE — ED Triage Notes (Signed)
Patient states that her COVId test was negative this morning, has sinus drainage from the left eye - possible pink eye.

## 2020-06-04 NOTE — Discharge Instructions (Addendum)
Use eye drops as prescribed and to completion Dispose of old contacts and wear glasses until you have finished course of antibiotic eye drops Wash pillow cases, wash hands regularly with soap and water, avoid touching your face and eyes, wash door handles, light switches, remotes and other objects you frequently touch Return or follow up with PCP if symptoms persists such as fever, chills, redne

## 2020-06-07 ENCOUNTER — Ambulatory Visit: Payer: Self-pay

## 2020-06-15 ENCOUNTER — Other Ambulatory Visit: Payer: Self-pay

## 2020-06-15 ENCOUNTER — Ambulatory Visit (INDEPENDENT_AMBULATORY_CARE_PROVIDER_SITE_OTHER): Payer: BC Managed Care – PPO

## 2020-06-15 DIAGNOSIS — Z3042 Encounter for surveillance of injectable contraceptive: Secondary | ICD-10-CM

## 2020-06-15 MED ORDER — MEDROXYPROGESTERONE ACETATE 150 MG/ML IM SUSP
150.0000 mg | Freq: Once | INTRAMUSCULAR | Status: AC
  Administered 2020-06-15: 150 mg via INTRAMUSCULAR

## 2020-06-15 NOTE — Progress Notes (Signed)
Patient presents today for Depo Provera injection within dates. Given IM LD. Patient tolerated well.  

## 2020-06-16 ENCOUNTER — Ambulatory Visit: Payer: BC Managed Care – PPO

## 2020-09-07 ENCOUNTER — Ambulatory Visit: Payer: BC Managed Care – PPO

## 2020-09-08 ENCOUNTER — Telehealth: Payer: Self-pay

## 2020-09-08 DIAGNOSIS — Z3042 Encounter for surveillance of injectable contraceptive: Secondary | ICD-10-CM

## 2020-09-08 MED ORDER — MEDROXYPROGESTERONE ACETATE 150 MG/ML IM SUSP: 150.0000 mg | INTRAMUSCULAR | 0 refills | Status: DC

## 2020-09-08 NOTE — Telephone Encounter (Signed)
Patient has apt tomorrow for Depo Injection. She is out of refills. SV#[phone]

## 2020-09-08 NOTE — Telephone Encounter (Signed)
Spoke w/patient. Advised Due for AE. No AE apts available w/[facility] prior to next Depo being due. Scheduled AE w/[facility] 09/09/20 @3 :30. Refill sent to pharmacy on file.

## 2020-09-08 NOTE — Telephone Encounter (Signed)
Spoke w/patient. Advised per Alexis Ward is not yet credentialed w/her insurance. Patient can keep 4pm apt tomorrow for depo. She will contact our office to schedule next available AE apt w/another provider at her convenience.

## 2020-09-09 ENCOUNTER — Ambulatory Visit: Payer: Self-pay | Admitting: Obstetrics and Gynecology

## 2020-09-09 ENCOUNTER — Ambulatory Visit (INDEPENDENT_AMBULATORY_CARE_PROVIDER_SITE_OTHER): Payer: BC Managed Care – PPO

## 2020-09-09 ENCOUNTER — Other Ambulatory Visit: Payer: Self-pay

## 2020-09-09 ENCOUNTER — Ambulatory Visit: Payer: BC Managed Care – PPO

## 2020-09-09 DIAGNOSIS — Z3042 Encounter for surveillance of injectable contraceptive: Secondary | ICD-10-CM | POA: Diagnosis not present

## 2020-09-09 MED ORDER — MEDROXYPROGESTERONE ACETATE 150 MG/ML IM SUSP
150.0000 mg | Freq: Once | INTRAMUSCULAR | Status: AC
  Administered 2020-09-09: 150 mg via INTRAMUSCULAR

## 2020-09-09 NOTE — Progress Notes (Signed)
Patient presents today for Depo Provera injection within dates. Given IM Right Deltoid per patient request. Patient tolerated well. 

## 2020-09-23 ENCOUNTER — Ambulatory Visit: Payer: Self-pay | Admitting: Advanced Practice Midwife

## 2020-11-09 ENCOUNTER — Ambulatory Visit: Payer: Self-pay | Admitting: Advanced Practice Midwife

## 2020-11-23 DIAGNOSIS — Z803 Family history of malignant neoplasm of breast: Secondary | ICD-10-CM

## 2020-11-23 DIAGNOSIS — Z1371 Encounter for nonprocreative screening for genetic disease carrier status: Secondary | ICD-10-CM

## 2020-11-23 HISTORY — DX: Encounter for nonprocreative screening for genetic disease carrier status: Z13.71

## 2020-11-23 HISTORY — DX: Family history of malignant neoplasm of breast: Z80.3

## 2020-11-26 ENCOUNTER — Other Ambulatory Visit (HOSPITAL_COMMUNITY)
Admission: RE | Admit: 2020-11-26 | Discharge: 2020-11-26 | Disposition: A | Discharge status: Home / Self Care | Payer: BC Managed Care – PPO | Source: Ambulatory Visit | Attending: Obstetrics and Gynecology | Admitting: Obstetrics and Gynecology

## 2020-11-26 ENCOUNTER — Encounter: Payer: Self-pay | Admitting: Advanced Practice Midwife

## 2020-11-26 ENCOUNTER — Telehealth: Payer: Self-pay

## 2020-11-26 ENCOUNTER — Other Ambulatory Visit: Payer: Self-pay

## 2020-11-26 ENCOUNTER — Ambulatory Visit (INDEPENDENT_AMBULATORY_CARE_PROVIDER_SITE_OTHER): Payer: BC Managed Care – PPO | Admitting: Advanced Practice Midwife

## 2020-11-26 VITALS — BP 118/74 | Ht 62.0 in | Wt 187.0 lb

## 2020-11-26 DIAGNOSIS — Z3042 Encounter for surveillance of injectable contraceptive: Secondary | ICD-10-CM | POA: Diagnosis not present

## 2020-11-26 DIAGNOSIS — Z01419 Encounter for gynecological examination (general) (routine) without abnormal findings: Secondary | ICD-10-CM

## 2020-11-26 DIAGNOSIS — Z124 Encounter for screening for malignant neoplasm of cervix: Secondary | ICD-10-CM | POA: Insufficient documentation

## 2020-11-26 DIAGNOSIS — Z803 Family history of malignant neoplasm of breast: Secondary | ICD-10-CM | POA: Diagnosis not present

## 2020-11-26 MED ORDER — MEDROXYPROGESTERONE ACETATE 150 MG/ML IM SUSP: 150.0000 mg | INTRAMUSCULAR | 0 refills | Status: DC

## 2020-11-26 NOTE — Telephone Encounter (Signed)
Pt calling; is on period and has appt later today; come in or resch.  [phone]  Adv pt to keep appt as it is easier to schedule for pap only visit than for the annual.

## 2020-11-26 NOTE — Progress Notes (Signed)
Gynecology Annual Exam  PCP: Patient, No Pcp Per  Chief Complaint:  Chief Complaint  Patient presents with  . Annual Exam  . Menstrual Problem    History of Present Illness: Patient is a 25 y.o. [id number] presents for annual exam. The patient has no gyn complaints today. We discussed birth control options in the setting of long term use of Depo and the potential for calcium bone loss. She thinks she has been on it for 10 years. She is currently having a longer than usual period and is due for an injection next week. She would like to get that injection and then consider what she may use next. She leans toward OCP since she does not like the idea of implant or IUD.  LMP: Patient's last menstrual period was 11/15/2020. Menarche:13 Average Interval: irregular about every 6 months Duration of flow: 3-4 days Heavy Menses: no Clots: no Intermenstrual Bleeding: no Postcoital Bleeding: no Dysmenorrhea: no  The patient is sexually active. She currently uses Depo-Provera injections for contraception. She denies dyspareunia.  The patient does perform self breast exams.  There is notable family history of breast or ovarian cancer in her family. Her paternal aunt and paternal grandmother had breast cancer diagnosed in their 30s. She accepts [facility] testing and will complete that today.  The patient wears seatbelts: yes.  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    The patient denies current symptoms of depression.    Review of Systems: Review of Systems  Constitutional: Negative for chills and fever.  HENT: Negative for congestion, ear discharge, ear pain, hearing loss, sinus pain and sore throat.   Eyes: Negative for blurred vision and double vision.  Respiratory: Negative for cough, shortness of breath and  wheezing.   Cardiovascular: Negative for chest pain, palpitations and leg swelling.  Gastrointestinal: Negative for abdominal pain, blood in stool, constipation, diarrhea, heartburn, melena, nausea and vomiting.  Genitourinary: Negative for dysuria, flank pain, frequency, hematuria and urgency.  Musculoskeletal: Negative for back pain, joint pain and myalgias.  Skin: Negative for itching and rash.  Neurological: Negative for dizziness, tingling, tremors, sensory change, speech change, focal weakness, seizures, loss of consciousness, weakness and headaches.  Endo/Heme/Allergies: Negative for environmental allergies. Does not bruise/bleed easily.  Psychiatric/Behavioral: Negative for depression, hallucinations, memory loss, substance abuse and suicidal ideas. The patient is not nervous/anxious and does not have insomnia.     Past Medical History:  There are no problems to display for this patient.   Past Surgical History:  Past Surgical History:  Procedure Laterality Date  . NO PAST SURGERIES      Gynecologic History:  Patient's last menstrual period was 11/15/2020. Contraception: Depo-Provera injections Last Pap: 2019 Results were:  no abnormalities   Obstetric History: [id number]  Family History:  History reviewed. No pertinent family history.  Social History:  Social History   Socioeconomic History  . Marital status: Married    Spouse name: Not on file  . Number of children: Not on file  . Years of education: Not on file  . Highest education level: Not on file  Occupational History  . Not on file  Tobacco Use  . Smoking status: Never Smoker  . Smokeless tobacco: Never Used  Vaping Use  . Vaping Use: Never  used  Substance and Sexual Activity  . Alcohol use: No  . Drug use: No  . Sexual activity: Yes    Birth control/protection: Injection  Other Topics Concern  . Not on file  Social History Narrative  . Not on file   Social Determinants of Health   Financial  Resource Strain: Not on file  Food Insecurity: Not on file  Transportation Needs: Not on file  Physical Activity: Not on file  Stress: Not on file  Social Connections: Not on file  Intimate Partner Violence: Not on file    Allergies:  No Known Allergies  Medications: Prior to Admission medications   Medication Sig Start Date End Date Taking? Authorizing Provider  acetaminophen (TYLENOL) 325 MG tablet Take 2 tablets (650 mg total) by mouth every 4 (four) hours as needed for mild pain or moderate pain (for pain scale < 4). Patient not taking: Reported on 11/26/2020 07/23/19   Farrel Conners, CNM  ibuprofen (ADVIL) 600 MG tablet Take 1 tablet (600 mg total) by mouth every 6 (six) hours. Patient not taking: Reported on 11/26/2020 07/23/19   Farrel Conners, CNM  medroxyPROGESTERone (DEPO-PROVERA) 150 MG/ML injection Inject 1 mL (150 mg total) into the muscle every 3 (three) months. 11/26/20   Tresea Mall, CNM  Prenatal Vit-Fe Fumarate-FA (MULTIVITAMIN-PRENATAL) 27-0.8 MG TABS tablet Take 1 tablet by mouth daily at 12 noon. Patient not taking: Reported on 11/26/2020    [provider]    Physical Exam Vitals: Blood pressure 118/74, height 5\' 2"  (1.575 m), weight 187 lb (84.8 kg), last menstrual period 11/15/2020, unknown if currently breastfeeding.  General: NAD HEENT: normocephalic, anicteric Thyroid: no enlargement, no palpable nodules Pulmonary: No increased work of breathing, CTAB Cardiovascular: RRR, distal pulses 2+ Breast: Breast symmetrical, no tenderness, no palpable nodules or masses, no skin or nipple retraction present, no nipple discharge.  No axillary or supraclavicular lymphadenopathy. Abdomen: NABS, soft, non-tender, non-distended.  Umbilicus without lesions.  No hepatomegaly, splenomegaly or masses palpable. No evidence of hernia  Genitourinary:  External: Normal external female genitalia.  Normal urethral meatus, normal Bartholin's and Skene's glands.     Vagina: Normal vaginal mucosa, no evidence of prolapse.    Cervix: Grossly normal in appearance, no bleeding  Uterus: Non-enlarged, mobile, normal contour.  No CMT  Adnexa: ovaries non-enlarged, no adnexal masses  Rectal: deferred  Lymphatic: no evidence of inguinal lymphadenopathy Extremities: no edema, erythema, or tenderness Neurologic: Grossly intact Psychiatric: mood appropriate, affect full   Assessment: 25 y.o. 22 routine annual exam  Plan: Problem List Items Addressed This Visit   None   Visit Diagnoses    Well woman exam with routine gynecological exam    -  Primary   Relevant Medications   medroxyPROGESTERone (DEPO-PROVERA) 150 MG/ML injection   Other Relevant Orders   Cytology - PAP   Encounter for surveillance of injectable contraceptive       Relevant Medications   medroxyPROGESTERone (DEPO-PROVERA) 150 MG/ML injection   Screening for cervical cancer       Relevant Orders   Cytology - PAP      1) 4) Gardasil Series discussed and if applicable offered to patient - Patient has previously completed 3 shot series   2) STI screening  was offered and declined  3)  ASCCP guidelines and rationale discussed.  Patient opts for every 3 years screening interval  4) Contraception - the patient is currently using  Depo-Provera injections.  She is interested in changing to OCPs. She will  do more research on options and let me know prior to next due Depo. We discussed safe sex practices to reduce her furture risk of STI's.    5) Increase healthy lifestyle; diet, hydration with h2o, physical activity  6) Return in about 1 year (around 11/26/2021) for annual established gyn.   Tresea Mall, CNM [facility] 11/26/2020, 4:42 PM

## 2020-11-26 NOTE — Patient Instructions (Signed)
Contraception Choices Contraception, also called birth control, refers to methods or devices that prevent pregnancy. Hormonal methods Contraceptive implant A contraceptive implant is a thin, plastic tube that contains a hormone that prevents pregnancy. It is different from an intrauterine device (IUD). It is inserted into the upper part of the arm by a health care provider. Implants can be effective for up to 3 years. Progestin-only injections Progestin-only injections are injections of progestin, a synthetic form of the hormone progesterone. They are given every 3 months by a health care provider. Birth control pills Birth control pills are pills that contain hormones that prevent pregnancy. They must be taken once a day, preferably at the same time each day. A prescription is needed to use this method of contraception. Birth control patch The birth control patch contains hormones that prevent pregnancy. It is placed on the skin and must be changed once a week for three weeks and removed on the fourth week. A prescription is needed to use this method of contraception. Vaginal ring A vaginal ring contains hormones that prevent pregnancy. It is placed in the vagina for three weeks and removed on the fourth week. After that, the process is repeated with a new ring. A prescription is needed to use this method of contraception. Emergency contraceptive Emergency contraceptives prevent pregnancy after unprotected sex. They come in pill form and can be taken up to 5 days after sex. They work best the sooner they are taken after having sex. Most emergency contraceptives are available without a prescription. This method should not be used as your only form of birth control.   Barrier methods Female condom A female condom is a thin sheath that is worn over the penis during sex. Condoms keep sperm from going inside a woman's body. They can be used with a sperm-killing substance (spermicide) to increase their  effectiveness. They should be thrown away after one use. Female condom A female condom is a soft, loose-fitting sheath that is put into the vagina before sex. The condom keeps sperm from going inside a woman's body. They should be thrown away after one use. Diaphragm A diaphragm is a soft, dome-shaped barrier. It is inserted into the vagina before sex, along with a spermicide. The diaphragm blocks sperm from entering the uterus, and the spermicide kills sperm. A diaphragm should be left in the vagina for 6-8 hours after sex and removed within 24 hours. A diaphragm is prescribed and fitted by a health care provider. A diaphragm should be replaced every 1-2 years, after giving birth, after gaining more than 15 lb (6.8 kg), and after pelvic surgery. Cervical cap A cervical cap is a round, soft latex or plastic cup that fits over the cervix. It is inserted into the vagina before sex, along with spermicide. It blocks sperm from entering the uterus. The cap should be left in place for 6-8 hours after sex and removed within 48 hours. A cervical cap must be prescribed and fitted by a health care provider. It should be replaced every 2 years. Sponge A sponge is a soft, circular piece of polyurethane foam with spermicide in it. The sponge helps block sperm from entering the uterus, and the spermicide kills sperm. To use it, you make it wet and then insert it into the vagina. It should be inserted before sex, left in for at least 6 hours after sex, and removed and thrown away within 30 hours. Spermicides Spermicides are chemicals that kill or block sperm from entering the cervix   and uterus. They can come as a cream, jelly, suppository, foam, or tablet. A spermicide should be inserted into the vagina with an applicator at least 10-15 minutes before sex to allow time for it to work. The process must be repeated every time you have sex. Spermicides do not require a prescription.   Intrauterine  contraception Intrauterine device (IUD) An IUD is a T-shaped device that is put in a woman's uterus. There are two types:  Hormone IUD.This type contains progestin, a synthetic form of the hormone progesterone. This type can stay in place for 3-5 years.  Copper IUD.This type is wrapped in copper wire. It can stay in place for 10 years. Permanent methods of contraception Female tubal ligation In this method, a woman's fallopian tubes are sealed, tied, or blocked during surgery to prevent eggs from traveling to the uterus. Hysteroscopic sterilization In this method, a small, flexible insert is placed into each fallopian tube. The inserts cause scar tissue to form in the fallopian tubes and block them, so sperm cannot reach an egg. The procedure takes about 3 months to be effective. Another form of birth control must be used during those 3 months. Female sterilization This is a procedure to tie off the tubes that carry sperm (vasectomy). After the procedure, the man can still ejaculate fluid (semen). Another form of birth control must be used for 3 months after the procedure. Natural planning methods Natural family planning In this method, a couple does not have sex on days when the woman could become pregnant. Calendar method In this method, the woman keeps track of the length of each menstrual cycle, identifies the days when pregnancy can happen, and does not have sex on those days. Ovulation method In this method, a couple avoids sex during ovulation. Symptothermal method This method involves not having sex during ovulation. The woman typically checks for ovulation by watching changes in her temperature and in the consistency of cervical mucus. Post-ovulation method In this method, a couple waits to have sex until after ovulation. Where to find more information  Centers for Disease Control and Prevention: [employer] Summary  Contraception, also called birth control, refers to methods or  devices that prevent pregnancy.  Hormonal methods of contraception include implants, injections, pills, patches, vaginal rings, and emergency contraceptives.  Barrier methods of contraception can include female condoms, female condoms, diaphragms, cervical caps, sponges, and spermicides.  There are two types of IUDs (intrauterine devices). An IUD can be put in a woman's uterus to prevent pregnancy for 3-5 years.  Permanent sterilization can be done through a procedure for males and females. Natural family planning methods involve nothaving sex on days when the woman could become pregnant. This information is not intended to replace advice given to you by your health care provider. Make sure you discuss any questions you have with your health care provider. Document Revised: 02/16/2020 Document Reviewed: 02/16/2020 Elsevier Patient Education  2021 Elsevier Inc. Health Maintenance, Female Adopting a healthy lifestyle and getting preventive care are important in promoting health and wellness. Ask your health care provider about:  The right schedule for you to have regular tests and exams.  Things you can do on your own to prevent diseases and keep yourself healthy. What should I know about diet, weight, and exercise? Eat a healthy diet  Eat a diet that includes plenty of vegetables, fruits, low-fat dairy products, and lean protein.  Do not eat a lot of foods that are high in solid fats, added sugars,  or sodium.   Maintain a healthy weight Body mass index (BMI) is used to identify weight problems. It estimates body fat based on height and weight. Your health care provider can help determine your BMI and help you achieve or maintain a healthy weight. Get regular exercise Get regular exercise. This is one of the most important things you can do for your health. Most adults should:  Exercise for at least 150 minutes each week. The exercise should increase your heart rate and make you sweat  (moderate-intensity exercise).  Do strengthening exercises at least twice a week. This is in addition to the moderate-intensity exercise.  Spend less time sitting. Even light physical activity can be beneficial. Watch cholesterol and blood lipids Have your blood tested for lipids and cholesterol at 26 years of age, then have this test every 5 years. Have your cholesterol levels checked more often if:  Your lipid or cholesterol levels are high.  You are older than 26 years of age.  You are at high risk for heart disease. What should I know about cancer screening? Depending on your health history and family history, you may need to have cancer screening at various ages. This may include screening for:  Breast cancer.  Cervical cancer.  Colorectal cancer.  Skin cancer.  Lung cancer. What should I know about heart disease, diabetes, and high blood pressure? Blood pressure and heart disease  High blood pressure causes heart disease and increases the risk of stroke. This is more likely to develop in people who have high blood pressure readings, are of African descent, or are overweight.  Have your blood pressure checked: ? Every 3-5 years if you are 31-49 years of age. ? Every year if you are 73 years old or older. Diabetes Have regular diabetes screenings. This checks your fasting blood sugar level. Have the screening done:  Once every three years after age 47 if you are at a normal weight and have a low risk for diabetes.  More often and at a younger age if you are overweight or have a high risk for diabetes. What should I know about preventing infection? Hepatitis B If you have a higher risk for hepatitis B, you should be screened for this virus. Talk with your health care provider to find out if you are at risk for hepatitis B infection. Hepatitis C Testing is recommended for:  Everyone born from [birth year] through 1966.  Anyone with known risk factors for hepatitis  C. Sexually transmitted infections (STIs)  Get screened for STIs, including gonorrhea and chlamydia, if: ? You are sexually active and are younger than 26 years of age. ? You are older than 26 years of age and your health care provider tells you that you are at risk for this type of infection. ? Your sexual activity has changed since you were last screened, and you are at increased risk for chlamydia or gonorrhea. Ask your health care provider if you are at risk.  Ask your health care provider about whether you are at high risk for HIV. Your health care provider may recommend a prescription medicine to help prevent HIV infection. If you choose to take medicine to prevent HIV, you should first get tested for HIV. You should then be tested every 3 months for as long as you are taking the medicine. Pregnancy  If you are about to stop having your period (premenopausal) and you may become pregnant, seek counseling before you get pregnant.  Take 400 to 800  micrograms (mcg) of folic acid every day if you become pregnant.  Ask for birth control (contraception) if you want to prevent pregnancy. Osteoporosis and menopause Osteoporosis is a disease in which the bones lose minerals and strength with aging. This can result in bone fractures. If you are 5 years old or older, or if you are at risk for osteoporosis and fractures, ask your health care provider if you should:  Be screened for bone loss.  Take a calcium or vitamin D supplement to lower your risk of fractures.  Be given hormone replacement therapy (HRT) to treat symptoms of menopause. Follow these instructions at home: Lifestyle  Do not use any products that contain nicotine or tobacco, such as cigarettes, e-cigarettes, and chewing tobacco. If you need help quitting, ask your health care provider.  Do not use street drugs.  Do not share needles.  Ask your health care provider for help if you need support or information about quitting  drugs. Alcohol use  Do not drink alcohol if: ? Your health care provider tells you not to drink. ? You are pregnant, may be pregnant, or are planning to become pregnant.  If you drink alcohol: ? Limit how much you use to 0-1 drink a day. ? Limit intake if you are breastfeeding.  Be aware of how much alcohol is in your drink. In the [place] one drink equals one 12 oz bottle of beer (355 mL), one 5 oz glass of wine (148 mL), or one 1 oz glass of hard liquor (44 mL). General instructions  Schedule regular health, dental, and eye exams.  Stay current with your vaccines.  Tell your health care provider if: ? You often feel depressed. ? You have ever been abused or do not feel safe at home. Summary  Adopting a healthy lifestyle and getting preventive care are important in promoting health and wellness.  Follow your health care provider's instructions about healthy diet, exercising, and getting tested or screened for diseases.  Follow your health care provider's instructions on monitoring your cholesterol and blood pressure. This information is not intended to replace advice given to you by your health care provider. Make sure you discuss any questions you have with your health care provider. Document Revised: 09/04/2018 Document Reviewed: 09/04/2018 Elsevier Patient Education  2021 [employer].

## 2020-11-30 LAB — CYTOLOGY - PAP: Diagnosis: NEGATIVE

## 2020-12-02 ENCOUNTER — Ambulatory Visit: Payer: BC Managed Care – PPO

## 2020-12-02 ENCOUNTER — Other Ambulatory Visit: Payer: Self-pay

## 2020-12-02 ENCOUNTER — Ambulatory Visit (INDEPENDENT_AMBULATORY_CARE_PROVIDER_SITE_OTHER): Payer: BC Managed Care – PPO

## 2020-12-02 DIAGNOSIS — Z3042 Encounter for surveillance of injectable contraceptive: Secondary | ICD-10-CM

## 2020-12-02 MED ORDER — MEDROXYPROGESTERONE ACETATE 150 MG/ML IM SUSP
150.0000 mg | Freq: Once | INTRAMUSCULAR | Status: AC
  Administered 2020-12-02: 150 mg via INTRAMUSCULAR

## 2020-12-02 NOTE — Telephone Encounter (Signed)
Please advise 

## 2020-12-02 NOTE — Progress Notes (Signed)
Patient presents today for Depo Provera injection within dates. Given IM Leftt deltoid per patient request. Patient tolerated well. Patient states this is her last injection. She discussed switching birth control after this dose. Discussed with Tresea Mall, CNM. Patient requests to switch to Oral Contraceptives. Patient was advised to start pills 12 weeks from today 02/24/2021 when her next Depo would be due.

## 2020-12-03 ENCOUNTER — Other Ambulatory Visit: Payer: Self-pay | Admitting: Advanced Practice Midwife

## 2020-12-03 DIAGNOSIS — Z30011 Encounter for initial prescription of contraceptive pills: Secondary | ICD-10-CM

## 2020-12-03 MED ORDER — NORGESTIMATE-ETH ESTRADIOL 0.25-35 MG-MCG PO TABS: 1.0000 | ORAL_TABLET | Freq: Every day | ORAL | 3 refills | Status: DC

## 2020-12-14 ENCOUNTER — Encounter: Payer: Self-pay | Admitting: Obstetrics and Gynecology

## 2020-12-14 ENCOUNTER — Other Ambulatory Visit: Payer: Self-pay | Admitting: Obstetrics and Gynecology

## 2020-12-14 DIAGNOSIS — Z803 Family history of malignant neoplasm of breast: Secondary | ICD-10-CM

## 2020-12-14 NOTE — Progress Notes (Signed)
Order for [facility] testing, done by JEG 3/22

## 2021-04-21 ENCOUNTER — Ambulatory Visit (INDEPENDENT_AMBULATORY_CARE_PROVIDER_SITE_OTHER): Payer: BC Managed Care – PPO | Admitting: Obstetrics and Gynecology

## 2021-04-21 ENCOUNTER — Other Ambulatory Visit: Payer: Self-pay

## 2021-04-21 ENCOUNTER — Encounter: Payer: Self-pay | Admitting: Obstetrics and Gynecology

## 2021-04-21 VITALS — BP 120/70 | Ht 62.0 in | Wt 183.0 lb

## 2021-04-21 DIAGNOSIS — N644 Mastodynia: Secondary | ICD-10-CM

## 2021-04-21 DIAGNOSIS — Z803 Family history of malignant neoplasm of breast: Secondary | ICD-10-CM

## 2021-04-21 NOTE — Patient Instructions (Signed)
I value your feedback and you entrusting us with your care. If you get a [facility] patient survey, I would appreciate you taking the time to let us know about your experience today. Thank you! ? ? ?

## 2021-04-21 NOTE — Progress Notes (Signed)
Patient, No Pcp Per (Inactive)   Chief Complaint  Patient presents with   Breast Exam    Lump on left breast, sore x 2 days    HPI:      [name] is a 25 y.o. [id number] whose LMP was Patient's last menstrual period was 04/07/2021 (exact date)., presents today for LT breast mass and pain for the past 2 days. Can feel the mass in upright position with "smooshing" breast tissue together. Menses was last wk. Does SBE and noticed tenderness in inner lower quadrant. No erythema, trauma, nipple d/c. Does drink caffeine. No prior breast issues, no previous imaging. FH breast cancer in PGM and pat aunt; MyRisk neg 3/22. IBIS=19%/riskscore=12%. Results never discussed with pt and can't download on MyChart.   Past Medical History:  Diagnosis Date   BRCA negative 11/2020   MyRisk neg   Family history of breast cancer 11/2020   IBIS=19%/riskscore=12%   Kidney stone    Renal disorder     Past Surgical History:  Procedure Laterality Date   NO PAST SURGERIES      Family History  Problem Relation Age of Onset   Breast cancer Paternal Grandmother        71s   Breast cancer Paternal Aunt        51s    Social History   Socioeconomic History   Marital status: Married    Spouse name: Not on file   Number of children: Not on file   Years of education: Not on file   Highest education level: Not on file  Occupational History   Not on file  Tobacco Use   Smoking status: Never   Smokeless tobacco: Never  Vaping Use   Vaping Use: Never used  Substance and Sexual Activity   Alcohol use: No   Drug use: No   Sexual activity: Yes    Birth control/protection: Pill  Other Topics Concern   Not on file  Social History Narrative   Not on file   Social Determinants of Health   Financial Resource Strain: Not on file  Food Insecurity: Not on file  Transportation Needs: Not on file  Physical Activity: Not on file  Stress: Not on file  Social Connections: Not on file   Intimate Partner Violence: Not on file    Outpatient Medications Prior to Visit  Medication Sig Dispense Refill   norgestimate-ethinyl estradiol (ORTHO-CYCLEN) 0.25-35 MG-MCG tablet Take 1 tablet by mouth daily. 90 tablet 3   No facility-administered medications prior to visit.      ROS:  Review of Systems  Constitutional:  Negative for fever.  Gastrointestinal:  Negative for blood in stool, constipation, diarrhea, nausea and vomiting.  Genitourinary:  Negative for dyspareunia, dysuria, flank pain, frequency, hematuria, urgency, vaginal bleeding, vaginal discharge and vaginal pain.  Musculoskeletal:  Negative for back pain.  Skin:  Negative for rash.  BREAST: mass/tenderness   OBJECTIVE:   Vitals:  BP 120/70   Ht 5' 2"  (1.575 m)   Wt 183 lb (83 kg)   LMP 04/07/2021 (Exact Date)   Breastfeeding No   BMI 33.47 kg/m   Physical Exam Vitals reviewed.  Pulmonary:     Effort: Pulmonary effort is normal.  Chest:  Breasts:    Breasts are symmetrical.     Right: No inverted nipple, mass, nipple discharge, skin change or tenderness.     Left: No inverted nipple, mass, nipple discharge, skin change or tenderness.    Musculoskeletal:  General: Normal range of motion.     Cervical back: Normal range of motion.  Skin:    General: Skin is warm and dry.  Neurological:     General: No focal deficit present.     Mental Status: She is alert and oriented to person, place, and time.     Cranial Nerves: No cranial nerve deficit.  Psychiatric:        Mood and Affect: Mood normal.        Behavior: Behavior normal.        Thought Content: Thought content normal.        Judgment: Judgment normal.    Assessment/Plan: Breast pain, left - Plan: US BREAST LTD UNI LEFT INC AXILLA; breast tenderness LT breast 7:00 position without discrete mass. D/C caffeine, check breast u/s.   Family history of breast cancer--MyRisk results reviewed and hard copy given to pt.  Pt understands  negative genetic testing doesn't mean she will never get any of these cancers. No increased breast screening at this time.     Return if symptoms worsen or fail to improve.  Idonia Zollinger B. Aditya Nastasi, PA-C 04/21/2021 5:08 PM

## 2021-04-23 ENCOUNTER — Encounter: Payer: Self-pay | Admitting: Emergency Medicine

## 2021-04-23 ENCOUNTER — Ambulatory Visit
Admission: EM | Admit: 2021-04-23 | Discharge: 2021-04-23 | Disposition: A | Discharge status: Home / Self Care | Payer: BC Managed Care – PPO

## 2021-04-23 ENCOUNTER — Other Ambulatory Visit: Payer: Self-pay

## 2021-04-23 DIAGNOSIS — R112 Nausea with vomiting, unspecified: Secondary | ICD-10-CM

## 2021-04-23 DIAGNOSIS — R109 Unspecified abdominal pain: Secondary | ICD-10-CM | POA: Diagnosis not present

## 2021-04-23 DIAGNOSIS — R197 Diarrhea, unspecified: Secondary | ICD-10-CM

## 2021-04-23 MED ORDER — ONDANSETRON 4 MG PO TBDP
4.0000 mg | ORAL_TABLET | Freq: Once | ORAL | Status: AC
  Administered 2021-04-23: 4 mg via ORAL

## 2021-04-23 MED ORDER — ONDANSETRON HCL 4 MG PO TABS: 4.0000 mg | ORAL_TABLET | Freq: Four times a day (QID) | ORAL | 0 refills | Status: DC

## 2021-04-23 MED ORDER — DICYCLOMINE HCL 20 MG PO TABS: 20.0000 mg | ORAL_TABLET | Freq: Two times a day (BID) | ORAL | 0 refills | Status: DC

## 2021-04-23 NOTE — ED Notes (Signed)
Pt unable to urinate at this time, provider aware.

## 2021-04-23 NOTE — ED Triage Notes (Signed)
Pt presents today with abd cramping with n/v/d that began two days ago. She has used Emmetrol OTC and Pepto Bismul with minimal relief.

## 2021-04-23 NOTE — ED Provider Notes (Signed)
[facility]   [id number] 04/23/21 Arrival Time: [phone]  CC: ABDOMINAL DISCOMFORT, N/V/D  SUBJECTIVE:  Alexis Ward is a 25 y.o. female who presents with complaint of nausea, vomiting, some diarrhea, and abdominal cramping x 2 days.  Denies a precipitating event, trauma, close contacts with similar symptoms, recent travel or antibiotic use.  Localizes pain to epigastric and LUQ region.  Describes as intermittent and cramping.  Has tried OTC medications without relief.  Worse with eating.  Reports darker urine.    Denies fever, chest pain, SOB, diarrhea, constipation, hematochezia, melena, dysuria, difficulty urinating, increased frequency or urgency, flank pain, loss of bowel or bladder function.   Patient's last menstrual period was 04/07/2021 (exact date).  ROS: As per HPI.  All other pertinent ROS negative.     Past Medical History:  Diagnosis Date   BRCA negative 11/2020   MyRisk neg   Family history of breast cancer 11/2020   IBIS=19%/riskscore=12%   Kidney stone    Renal disorder    Past Surgical History:  Procedure Laterality Date   NO PAST SURGERIES     No Known Allergies No current facility-administered medications on file prior to encounter.   Current Outpatient Medications on File Prior to Encounter  Medication Sig Dispense Refill   anti-nausea (EMETROL) solution Take 10 mLs by mouth every 15 (fifteen) minutes as needed for nausea or vomiting.     bismuth subsalicylate (PEPTO BISMOL) 262 MG/15ML suspension Take 30 mLs by mouth every 6 (six) hours as needed.     norgestimate-ethinyl estradiol (ORTHO-CYCLEN) 0.25-35 MG-MCG tablet Take 1 tablet by mouth daily. 90 tablet 3   Social History   Socioeconomic History   Marital status: Married    Spouse name: Not on file   Number of children: Not on file   Years of education: Not on file   Highest education level: Not on file  Occupational History   Not on file  Tobacco Use   Smoking status: Never    Smokeless tobacco: Never  Vaping Use   Vaping Use: Never used  Substance and Sexual Activity   Alcohol use: No   Drug use: No   Sexual activity: Yes    Birth control/protection: Pill  Other Topics Concern   Not on file  Social History Narrative   Not on file   Social Determinants of Health   Financial Resource Strain: Not on file  Food Insecurity: Not on file  Transportation Needs: Not on file  Physical Activity: Not on file  Stress: Not on file  Social Connections: Not on file  Intimate Partner Violence: Not on file   Family History  Problem Relation Age of Onset   Breast cancer Paternal Grandmother        44s   Breast cancer Paternal Aunt        3s     OBJECTIVE:  Vitals:   04/23/21 0901  BP: 115/76  Pulse: 84  Resp: 18  Temp: 99.4 F (37.4 C)  TempSrc: Oral  SpO2: 98%    General appearance: Alert; NAD HEENT: NCAT.  Oropharynx clear.  Lungs: clear to auscultation bilaterally without adventitious breath sounds Heart: regular rate and rhythm.   Abdomen: soft, non-distended; normal active bowel sounds; mild diffuse TTP; nontender at McBurney's point; no guarding Back: no CVA tenderness Extremities: no edema; symmetrical with no gross deformities Skin: warm and dry Neurologic: normal gait Psychological: alert and cooperative; normal mood and affect   ASSESSMENT & PLAN:  1. Abdominal  discomfort   2. Abdominal cramping   3. Nausea vomiting and diarrhea     Meds ordered this encounter  Medications   ondansetron (ZOFRAN-ODT) disintegrating tablet 4 mg   ondansetron (ZOFRAN) 4 MG tablet    Sig: Take 1 tablet (4 mg total) by mouth every 6 (six) hours.    Dispense:  12 tablet    Refill:  0    Order Specific Question:   Supervising Provider    Answer:   Raylene Everts [[id number]]   dicyclomine (BENTYL) 20 MG tablet    Sig: Take 1 tablet (20 mg total) by mouth 2 (two) times daily.    Dispense:  20 tablet    Refill:  0    Order Specific Question:    Supervising Provider    Answer:   Raylene Everts [[id number]]   Unable to rule out appendicitis, ectopic pregnancy, gall bladder disease, ulcer, diverticulitis, etc... in urgent care setting.  Offered patient further evaluation and management in the ED.  Patient declines at this time and would like to try outpatient therapy first.  Aware of the risk associated with this decision including missed diagnosis, organ damage, organ failure, and/or death.  Patient aware and in agreement.     Unable to give urine sample in office Not concerned for pregnancy, just had menstrual cycle 2 weeks ago Zofran given in office Zofran and bentyl prescribed Stick with bland diet, and slow progress as tolerated Push fluids If you experience new or worsening symptoms return or go to ER such as fever, chills, nausea, vomiting, diarrhea, bloody or dark tarry stools, constipation, urinary symptoms, worsening abdominal discomfort, symptoms that do not improve with medications, inability to keep fluids down, etc...  Reviewed expectations re: course of current medical issues. Questions answered. Outlined signs and symptoms indicating need for more acute intervention. Patient verbalized understanding. After Visit Summary given.   Lestine Box, PA-C 04/23/21 [phone]

## 2021-04-23 NOTE — Discharge Instructions (Addendum)
Unable to rule out appendicitis, ectopic pregnancy, gall bladder disease, ulcer, diverticulitis, etc... in urgent care setting.  Offered patient further evaluation and management in the ED.  Patient declines at this time and would like to try outpatient therapy first.  Aware of the risk associated with this decision including missed diagnosis, organ damage, organ failure, and/or death.  Patient aware and in agreement.     Unable to give urine sample in office Not concerned for pregnancy, just had menstrual cycle 2 weeks ago Zofran given in office Zofran and bentyl prescribed Stick with bland diet, and slow progress as tolerated Push fluids If you experience new or worsening symptoms return or go to ER such as fever, chills, nausea, vomiting, diarrhea, bloody or dark tarry stools, constipation, urinary symptoms, worsening abdominal discomfort, symptoms that do not improve with medications, inability to keep fluids down, etc..Alexis Ward

## 2021-04-27 ENCOUNTER — Inpatient Hospital Stay: Admission: RE | Admit: 2021-04-27 | Payer: BC Managed Care – PPO | Source: Ambulatory Visit

## 2021-05-23 ENCOUNTER — Telehealth: Payer: Self-pay

## 2021-05-23 NOTE — Telephone Encounter (Signed)
Take first AM UPT. If neg, see what bleeding does this month. On generic pills and can sometimes have BTB. Past due for annual as well, so can schedule to f/u.

## 2021-05-23 NOTE — Telephone Encounter (Signed)
Pt aware.

## 2021-05-23 NOTE — Telephone Encounter (Signed)
Pt calling; is spotting after having her period.  What to do?  [phone]  Pt states she has not missed any pills nor has she been late taking any pills.  Adv will send msg to [facility].

## 2021-06-06 ENCOUNTER — Other Ambulatory Visit (HOSPITAL_COMMUNITY)
Admission: RE | Admit: 2021-06-06 | Discharge: 2021-06-06 | Disposition: A | Discharge status: Home / Self Care | Payer: BC Managed Care – PPO | Source: Ambulatory Visit | Attending: Obstetrics and Gynecology | Admitting: Obstetrics and Gynecology

## 2021-06-06 ENCOUNTER — Other Ambulatory Visit: Payer: Self-pay

## 2021-06-06 ENCOUNTER — Encounter: Payer: Self-pay | Admitting: Obstetrics and Gynecology

## 2021-06-06 ENCOUNTER — Ambulatory Visit (INDEPENDENT_AMBULATORY_CARE_PROVIDER_SITE_OTHER): Payer: BC Managed Care – PPO | Admitting: Obstetrics and Gynecology

## 2021-06-06 VITALS — BP 110/70 | Ht 62.0 in | Wt 182.0 lb

## 2021-06-06 DIAGNOSIS — N921 Excessive and frequent menstruation with irregular cycle: Secondary | ICD-10-CM

## 2021-06-06 DIAGNOSIS — Z113 Encounter for screening for infections with a predominantly sexual mode of transmission: Secondary | ICD-10-CM | POA: Diagnosis not present

## 2021-06-06 DIAGNOSIS — N926 Irregular menstruation, unspecified: Secondary | ICD-10-CM | POA: Diagnosis not present

## 2021-06-06 LAB — POCT URINE PREGNANCY: Preg Test, Ur: NEGATIVE

## 2021-06-06 NOTE — Patient Instructions (Signed)
I value your feedback and you entrusting us with your care. If you get a [facility] patient survey, I would appreciate you taking the time to let us know about your experience today. Thank you! ? ? ?

## 2021-06-06 NOTE — Progress Notes (Signed)
Patient, No Pcp Per (Inactive)   Chief Complaint  Patient presents with   Menstrual Problem    Pt has had 3 cycles in one month, no abnormal pain, heavy flow    HPI:      [name] is a 25 y.o. [id number] whose LMP was Patient's last menstrual period was 05/28/2021 (exact date)., presents today for BTB on OCPs this cycle. Started OCPs 5/22 after coming off depo. Menses have been monthly, lasting 6-7 days, mod flow, mild dysmen. Had BTB this cycle without late/missed pills. No dysmen. No vag, urin sx, no fevers, no recent sickness/stress/wt change.  She is sex active, no new partners, no recent UPT.  LT Breast pain from 7/22 resolved, pt never did u/s. Doing well.   Past Medical History:  Diagnosis Date   BRCA negative 11/2020   MyRisk neg   Family history of breast cancer 11/2020   IBIS=19%/riskscore=12%   Kidney stone    Renal disorder     Past Surgical History:  Procedure Laterality Date   NO PAST SURGERIES      Family History  Problem Relation Age of Onset   Breast cancer Paternal Grandmother        62s   Breast cancer Paternal Aunt        89s    Social History   Socioeconomic History   Marital status: Married    Spouse name: Not on file   Number of children: Not on file   Years of education: Not on file   Highest education level: Not on file  Occupational History   Not on file  Tobacco Use   Smoking status: Never   Smokeless tobacco: Never  Vaping Use   Vaping Use: Never used  Substance and Sexual Activity   Alcohol use: No   Drug use: No   Sexual activity: Yes    Birth control/protection: Pill  Other Topics Concern   Not on file  Social History Narrative   Not on file   Social Determinants of Health   Financial Resource Strain: Not on file  Food Insecurity: Not on file  Transportation Needs: Not on file  Physical Activity: Not on file  Stress: Not on file  Social Connections: Not on file  Intimate Partner Violence: Not on file     Outpatient Medications Prior to Visit  Medication Sig Dispense Refill   norgestimate-ethinyl estradiol (ORTHO-CYCLEN) 0.25-35 MG-MCG tablet Take 1 tablet by mouth daily. 90 tablet 3   anti-nausea (EMETROL) solution Take 10 mLs by mouth every 15 (fifteen) minutes as needed for nausea or vomiting.     bismuth subsalicylate (PEPTO BISMOL) 262 MG/15ML suspension Take 30 mLs by mouth every 6 (six) hours as needed.     dicyclomine (BENTYL) 20 MG tablet Take 1 tablet (20 mg total) by mouth 2 (two) times daily. 20 tablet 0   ondansetron (ZOFRAN) 4 MG tablet Take 1 tablet (4 mg total) by mouth every 6 (six) hours. 12 tablet 0   No facility-administered medications prior to visit.     ROS:  Review of Systems  Constitutional:  Negative for fever.  Gastrointestinal:  Negative for blood in stool, constipation, diarrhea, nausea and vomiting.  Genitourinary:  Positive for menstrual problem. Negative for dyspareunia, dysuria, flank pain, frequency, hematuria, urgency, vaginal bleeding, vaginal discharge and vaginal pain.  Musculoskeletal:  Negative for back pain.  Skin:  Negative for rash.  BREAST: No symptoms   OBJECTIVE:   Vitals:  BP  110/70   Ht 5' 2"  (1.575 m)   Wt 182 lb (82.6 kg)   LMP 05/28/2021 (Exact Date)   Breastfeeding No   BMI 33.29 kg/m   Physical Exam Vitals reviewed.  Constitutional:      Appearance: She is well-developed.  Pulmonary:     Effort: Pulmonary effort is normal.  Genitourinary:    General: Normal vulva.     Pubic Area: No rash.      Labia:        Right: No rash, tenderness or lesion.        Left: No rash, tenderness or lesion.      Vagina: Normal. No vaginal discharge, erythema, tenderness or bleeding.     Cervix: Normal.     Uterus: Normal. Not enlarged and not tender.      Adnexa: Right adnexa normal and left adnexa normal.       Right: No mass or tenderness.         Left: No mass or tenderness.    Musculoskeletal:        General: Normal range  of motion.     Cervical back: Normal range of motion.  Skin:    General: Skin is warm and dry.  Neurological:     General: No focal deficit present.     Mental Status: She is alert and oriented to person, place, and time.  Psychiatric:        Mood and Affect: Mood normal.        Behavior: Behavior normal.        Thought Content: Thought content normal.        Judgment: Judgment normal.    Results: Results for orders placed or performed in visit on 06/06/21 (from the past 24 hour(s))  POCT urine pregnancy     Status: Normal   Collection Time: 06/06/21  4:45 PM  Result Value Ref Range   Preg Test, Ur Negative Negative     Assessment/Plan: Breakthrough bleeding on OCPs - Plan: Cervicovaginal ancillary only, POCT urine pregnancy; neg UPT, rule out STDs. If neg, most likely due to OCP new start 5/22, plus depo still getting out of system. Also could be generic OCPs. Reassurance. F/u prn. If sx persist, can change pills.   Screening for STD (sexually transmitted disease) - Plan: Cervicovaginal ancillary only    Return if symptoms worsen or fail to improve.  Alexis Bruhl B. Jamara Vary, PA-C 06/06/2021 4:47 PM

## 2021-06-08 LAB — CERVICOVAGINAL ANCILLARY ONLY
Chlamydia: NEGATIVE
Comment: NEGATIVE
Comment: NORMAL
Neisseria Gonorrhea: NEGATIVE

## 2021-07-26 ENCOUNTER — Encounter (HOSPITAL_COMMUNITY): Payer: Self-pay

## 2021-07-26 ENCOUNTER — Emergency Department (HOSPITAL_COMMUNITY): Payer: BC Managed Care – PPO

## 2021-07-26 ENCOUNTER — Emergency Department (HOSPITAL_COMMUNITY)
Admission: EM | Admit: 2021-07-26 | Discharge: 2021-07-26 | Disposition: A | Discharge status: Home / Self Care | Payer: BC Managed Care – PPO | Attending: Emergency Medicine | Admitting: Emergency Medicine

## 2021-07-26 DIAGNOSIS — Z0389 Encounter for observation for other suspected diseases and conditions ruled out: Secondary | ICD-10-CM | POA: Diagnosis not present

## 2021-07-26 DIAGNOSIS — U071 COVID-19: Secondary | ICD-10-CM | POA: Insufficient documentation

## 2021-07-26 DIAGNOSIS — D71 Functional disorders of polymorphonuclear neutrophils: Secondary | ICD-10-CM | POA: Diagnosis not present

## 2021-07-26 DIAGNOSIS — J029 Acute pharyngitis, unspecified: Secondary | ICD-10-CM | POA: Diagnosis not present

## 2021-07-26 DIAGNOSIS — R000 Tachycardia, unspecified: Secondary | ICD-10-CM | POA: Diagnosis not present

## 2021-07-26 LAB — CBC WITH DIFFERENTIAL/PLATELET
Abs Immature Granulocytes: 0.03 10*3/uL (ref 0.00–0.07)
Basophils Absolute: 0 10*3/uL (ref 0.0–0.1)
Basophils Relative: 0 %
Eosinophils Absolute: 0 10*3/uL (ref 0.0–0.5)
Eosinophils Relative: 0 %
HCT: 38.4 % (ref 36.0–46.0)
Hemoglobin: 12.8 g/dL (ref 12.0–15.0)
Immature Granulocytes: 1 %
Lymphocytes Relative: 5 %
Lymphs Abs: 0.3 10*3/uL — ABNORMAL LOW (ref 0.7–4.0)
MCH: 29.4 pg (ref 26.0–34.0)
MCHC: 33.3 g/dL (ref 30.0–36.0)
MCV: 88.1 fL (ref 80.0–100.0)
Monocytes Absolute: 0.4 10*3/uL (ref 0.1–1.0)
Monocytes Relative: 7 %
Neutro Abs: 5.7 10*3/uL (ref 1.7–7.7)
Neutrophils Relative %: 87 %
Platelets: 186 10*3/uL (ref 150–400)
RBC: 4.36 MIL/uL (ref 3.87–5.11)
RDW: 13.1 % (ref 11.5–15.5)
WBC: 6.6 10*3/uL (ref 4.0–10.5)
nRBC: 0 % (ref 0.0–0.2)

## 2021-07-26 LAB — RESP PANEL BY RT-PCR (FLU A&B, COVID) ARPGX2
Influenza A by PCR: NEGATIVE
Influenza B by PCR: NEGATIVE
SARS Coronavirus 2 by RT PCR: POSITIVE — AB

## 2021-07-26 LAB — GROUP A STREP BY PCR: Group A Strep by PCR: NOT DETECTED

## 2021-07-26 LAB — BASIC METABOLIC PANEL
Anion gap: 8 (ref 5–15)
BUN: 11 mg/dL (ref 6–20)
CO2: 22 mmol/L (ref 22–32)
Calcium: 8.9 mg/dL (ref 8.9–10.3)
Chloride: 105 mmol/L (ref 98–111)
Creatinine, Ser: 0.69 mg/dL (ref 0.44–1.00)
GFR, Estimated: >60 mL/min (ref >60)
Glucose, Bld: 100 mg/dL — ABNORMAL HIGH (ref 70–99)
Potassium: 3.7 mmol/L (ref 3.5–5.1)
Sodium: 135 mmol/L (ref 135–145)

## 2021-07-26 LAB — HCG, SERUM, QUALITATIVE: Preg, Serum: NEGATIVE

## 2021-07-26 MED ORDER — METOCLOPRAMIDE HCL 5 MG/ML IJ SOLN
10.0000 mg | Freq: Once | INTRAMUSCULAR | Status: AC
  Administered 2021-07-26: 10 mg via INTRAVENOUS
  Filled 2021-07-26: qty 2

## 2021-07-26 MED ORDER — SODIUM CHLORIDE 0.9 % IV BOLUS
500.0000 mL | Freq: Once | INTRAVENOUS | Status: AC
  Administered 2021-07-26: 500 mL via INTRAVENOUS

## 2021-07-26 MED ORDER — ACETAMINOPHEN 500 MG PO TABS
1000.0000 mg | ORAL_TABLET | Freq: Once | ORAL | Status: AC
  Administered 2021-07-26: 1000 mg via ORAL
  Filled 2021-07-26: qty 2

## 2021-07-26 MED ORDER — SODIUM CHLORIDE 0.9 % IV BOLUS
1000.0000 mL | Freq: Once | INTRAVENOUS | Status: AC
  Administered 2021-07-26: 1000 mL via INTRAVENOUS

## 2021-07-26 MED ORDER — NIRMATRELVIR/RITONAVIR (PAXLOVID)TABLET: 3.0000 | ORAL_TABLET | Freq: Two times a day (BID) | ORAL | 0 refills | Status: AC

## 2021-07-26 MED ORDER — KETOROLAC TROMETHAMINE 30 MG/ML IJ SOLN
15.0000 mg | Freq: Once | INTRAMUSCULAR | Status: AC
  Administered 2021-07-26: 15 mg via INTRAVENOUS
  Filled 2021-07-26: qty 1

## 2021-07-26 MED ORDER — IOHEXOL 350 MG/ML SOLN
100.0000 mL | Freq: Once | INTRAVENOUS | Status: AC | PRN
  Administered 2021-07-26: 75 mL via INTRAVENOUS

## 2021-07-26 NOTE — ED Provider Notes (Signed)
There is [facility] EMERGENCY DEPARTMENT Provider Note   CSN: [id number] Arrival date & time: 07/26/21  1521     History Chief Complaint  Patient presents with   Sore Throat    Alexis Ward is a 25 y.o. female.  HPI  Patient with no significant medical history presents to the emergency department with chief complaint of not feeling well.  Patient states this started yesterday, she states that she has felt very fatigued, tired, had a sore like throat, she denies difficulty swallowing, change in voice, states pain is only felt when she swallows.  She denies any subjective fevers, chills, nasal ingestion, cough, chest pain, shortness of breath, pleuritic chest pain, dyspnea on exertion, peripheral edema, abdominal pain, nausea, vomiting, general body aches.  Patient states that she is vaccinating is COVID-19, has not gotten her flu vaccine, states that her partner was recently diagnosed with COVID.  She has no other symptoms at this this time, states that she is still tolerating p.o., denies any alleviating or aggravating factors.  Past Medical History:  Diagnosis Date   BRCA negative 11/2020   MyRisk neg   Family history of breast cancer 11/2020   IBIS=19%/riskscore=12%   Kidney stone    Renal disorder     There are no problems to display for this patient.   Past Surgical History:  Procedure Laterality Date   NO PAST SURGERIES       OB History     Gravida  2   Para  2   Term  2   Preterm      AB      Living  2      SAB      IAB      Ectopic      Multiple  0   Live Births  2           Family History  Problem Relation Age of Onset   Breast cancer Paternal Grandmother        25s   Breast cancer Paternal Aunt        23s    Social History   Tobacco Use   Smoking status: Never   Smokeless tobacco: Never  Vaping Use   Vaping Use: Never used  Substance Use Topics   Alcohol use: No   Drug use: No    Home Medications Prior to Admission  medications   Medication Sig Start Date End Date Taking? Authorizing Provider  nirmatrelvir/ritonavir EUA (PAXLOVID) 20 x 150 MG & 10 x 100MG TABS Take 3 tablets by mouth 2 (two) times daily for 5 days. Take nirmatrelvir (150 mg) two tablets twice daily for 5 days and ritonavir (100 mg) one tablet twice daily for 5 days. 07/26/21 07/31/21 Yes Marcello Fennel, PA-C  norgestimate-ethinyl estradiol (ORTHO-CYCLEN) 0.25-35 MG-MCG tablet Take 1 tablet by mouth daily. 02/13/21   Rod Can, CNM    Allergies    Patient has no known allergies.  Review of Systems   Review of Systems  Constitutional:  Positive for fatigue. Negative for chills and fever.  HENT:  Negative for congestion.   Respiratory:  Negative for shortness of breath.   Cardiovascular:  Negative for chest pain.  Gastrointestinal:  Negative for abdominal pain, diarrhea, nausea and vomiting.  Genitourinary:  Negative for enuresis.  Musculoskeletal:  Negative for back pain.  Skin:  Negative for rash.  Neurological:  Positive for headaches. Negative for dizziness.  Hematological:  Does not bruise/bleed easily.  Physical Exam Updated Vital Signs BP 116/73   Pulse (!) 102   Temp 98.2 F (36.8 C) (Oral)   Resp (!) 21   Ht 5' 2"  (1.575 m)   Wt 85.7 kg   LMP 07/01/2021 (Approximate)   SpO2 97%   BMI 34.57 kg/m   Physical Exam Vitals and nursing note reviewed.  Constitutional:      General: She is not in acute distress.    Appearance: She is not ill-appearing.  HENT:     Head: Normocephalic and atraumatic.     Right Ear: Tympanic membrane, ear canal and external ear normal.     Left Ear: Tympanic membrane, ear canal and external ear normal.     Nose: No congestion.     Mouth/Throat:     Mouth: Mucous membranes are moist.     Pharynx: Oropharynx is clear. Posterior oropharyngeal erythema present.     Comments: No trismus or torticollis present, oropharynx is visualized tongue uvula are both midline, controlling oral  secretions, tonsils were equal and symmetrical bilaterally, she had slight erythema present, no exudates noted, no tongue elevation. Eyes:     Conjunctiva/sclera: Conjunctivae normal.  Cardiovascular:     Rate and Rhythm: Regular rhythm. Tachycardia present.     Pulses: Normal pulses.     Heart sounds: No murmur heard.   No friction rub. No gallop.  Pulmonary:     Effort: No respiratory distress.     Breath sounds: No wheezing, rhonchi or rales.  Abdominal:     Palpations: Abdomen is soft.     Tenderness: There is no abdominal tenderness. There is no right CVA tenderness or left CVA tenderness.  Musculoskeletal:     Right lower leg: No edema.     Left lower leg: No edema.     Comments: Moving all 4 extremities.  Skin:    General: Skin is warm and dry.  Neurological:     Mental Status: She is alert.     Comments: No facial asymmetry, no difficult word finding, able to follow two-step commands, no slurring of words, no unilateral weakness present.  Psychiatric:        Mood and Affect: Mood normal.    ED Results / Procedures / Treatments   Labs (all labs ordered are listed, but only abnormal results are displayed) Labs Reviewed  RESP PANEL BY RT-PCR (FLU A&B, COVID) ARPGX2 - Abnormal; Notable for the following components:      Result Value   SARS Coronavirus 2 by RT PCR POSITIVE (*)    All other components within normal limits  CBC WITH DIFFERENTIAL/PLATELET - Abnormal; Notable for the following components:   Lymphs Abs 0.3 (*)    All other components within normal limits  BASIC METABOLIC PANEL - Abnormal; Notable for the following components:   Glucose, Bld 100 (*)    All other components within normal limits  GROUP A STREP BY PCR  HCG, SERUM, QUALITATIVE    EKG EKG Interpretation  Date/Time:  Tuesday July 26 2021 18:27:32 EDT Ventricular Rate:  120 PR Interval:  154 QRS Duration: 102 QT Interval:  340 QTC Calculation: 481 R Axis:   87 Text  Interpretation: Sinus tachycardia Borderline prolonged QT interval No old tracing to compare Confirmed by Aletta Edouard [phone]) on 07/26/2021 6:59:44 PM  Radiology CT Angio Chest PE W and/or Wo Contrast  Result Date: 07/26/2021 CLINICAL DATA:  Concern for pulmonary embolism. EXAM: CT ANGIOGRAPHY CHEST WITH CONTRAST TECHNIQUE: Multidetector CT imaging of the  chest was performed using the standard protocol during bolus administration of intravenous contrast. Multiplanar CT image reconstructions and MIPs were obtained to evaluate the vascular anatomy. CONTRAST:  32m OMNIPAQUE IOHEXOL 350 MG/ML SOLN COMPARISON:  None. FINDINGS: Evaluation of this exam is limited due to respiratory motion artifact. Cardiovascular: There is no cardiomegaly or pericardial effusion. The thoracic aorta is unremarkable. The origins of the great vessels of the aortic arch appear patent. Evaluation of the pulmonary arteries is limited due to respiratory motion artifact. No pulmonary artery embolus identified. Mediastinum/Nodes: There is no hilar or mediastinal adenopathy. The esophagus is grossly unremarkable. No mediastinal fluid collection. Lungs/Pleura: The lungs are clear. There is no pleural effusion or pneumothorax. The central airways are patent. Upper Abdomen: Small scattered calcified splenic granuloma. Musculoskeletal: No chest wall abnormality. No acute or significant osseous findings. Review of the MIP images confirms the above findings. IMPRESSION: No acute intrathoracic pathology. No CT evidence of pulmonary embolism. Electronically Signed   By: AAnner CreteM.D.   On: 07/26/2021 21:10    Procedures Procedures   Medications Ordered in ED Medications  sodium chloride 0.9 % bolus 1,000 mL (0 mLs Intravenous Stopped 07/26/21 2208)  metoCLOPramide (REGLAN) injection 10 mg (10 mg Intravenous Given 07/26/21 1920)  ketorolac (TORADOL) 30 MG/ML injection 15 mg (15 mg Intravenous Given 07/26/21 1920)  sodium chloride 0.9  % bolus 500 mL (0 mLs Intravenous Stopped 07/26/21 2208)  acetaminophen (TYLENOL) tablet 1,000 mg (1,000 mg Oral Given 07/26/21 2037)  iohexol (OMNIPAQUE) 350 MG/ML injection 100 mL (75 mLs Intravenous Contrast Given 07/26/21 2103)    ED Course  I have reviewed the triage vital signs and the nursing notes.  Pertinent labs & imaging results that were available during my care of the patient were reviewed by me and considered in my medical decision making (see chart for details).    MDM Rules/Calculators/A&P                          Initial impression-patient presents with feeling fatigued and tired.  She is alert, does not appear acute stress, vital signs are for tachycardia.  Unclear etiology will obtain basic work-up, provide pain fluids, medication for migraine and reassess.  Work-up-CBC is unremarkable, BMP shows hyperglycemia of 100, strep test is negative, respiratory panel positive for COVID, urine pregnancy is negative, CT of chest is negative for acute findings.  EKG sinus tach without signs of ischemia.  Reassessment-patient was reassessed after a liter of fluids, migraine cocktail, states that she is feeling much better, she has no headache at this time, she remains tachycardic, getting up into the 140s, she denies chest pain, shortness of breath, denies pleuritic chest pain, she states that she does not feel palpitations,  patient has  never had this in the past,  she was slightly warm on my exam, will provide with Tylenol and additional fluids and reassess.  Patient was reassessed still slightly tachycardic and is tachypneic,  tested positive for COVID, this increase her risk for a possible PE, will obtain CTA of chest for rule out.  CT of chest was unremarkable, vital signs have improved  she is in the upper 90s low 100s, has no complaints patient agreed for discharge.  Rule out- I have low suspicion for ACS as history is atypical, patient has no cardiac history, EKG was sinus rhythm  without signs of ischemia, troponins were deferred as she had no chest pain.  Low suspicion for PE  CTA of chest is negative for acute findings.  Low suspicion for AAA or aortic dissection as history is atypical, patient has low risk factors. Low suspicion for systemic infection as patient is nontoxic-appearing, vital signs reassuring, no obvious source infection noted on exam.  Low suspicion for pneumonia as lung sounds are clear bilaterally,  imaging did not reveal any acute finding. low suspicion for strep throat strep is negative.  Low suspicion patient would need  hospitalized due to viral infection or Covid as vital signs reassuring, patient is not in respiratory distress.  Patient was noted to be tachycardic unclear etiology I suspect likely due to viral infection as well as being slightly febrile.    Plan-  COVID-patient tested positive for COVID, will start her on the antiviral treatments, follow-up with post COVID care, return precautions.  Vital signs have remained stable, no indication for hospital admission.  Patient discussed with attending and they agreed with assessment and plan.  Patient given at home care as well strict return precautions.  Patient verbalized that they understood agreed to said plan.  Final Clinical Impression(s) / ED Diagnoses Final diagnoses:  COVID    Rx / DC Orders ED Discharge Orders          Ordered    nirmatrelvir/ritonavir EUA (PAXLOVID) 20 x 150 MG & 10 x 100MG TABS  2 times daily        07/26/21 2302             Marcello Fennel, PA-C 07/26/21 2305    Hayden Rasmussen, MD 07/27/21 1101

## 2021-07-26 NOTE — Discharge Instructions (Signed)
You have been seen here for URI like symptoms.  I recommend taking Tylenol for fever control and ibuprofen for pain control please follow dosing on the back of bottle.  I recommend staying hydrated and if you do not an appetite, I recommend soups as this will provide you with fluids and calories.  You are COVID-positive, start on the antiviral treatment please take as prescribed.  Please be aware this can lower the effectiveness of oral birth control I recommend abstaining from sexual intercourse or using physical barriers for the next 10 days   you are Covid positive you must self quarantine for 5 days starting on symptom onset, if at the end of those 5 days you are feeling better you may return back to school/work, must wear a mask for additional 5 more days.  I would like you to contact "post Covid care" as they will provide you with information how to manage your Covid symptoms   Come back to the emergency department if you develop chest pain, shortness of breath, severe abdominal pain, uncontrolled nausea, vomiting, diarrhea.

## 2021-07-26 NOTE — ED Notes (Signed)
PA at bedside.

## 2021-07-26 NOTE — ED Triage Notes (Signed)
Pt. States they have a sore throat, headache and body aches. Pt. States these symptoms started yesterday. Pt. States they have been taking tylenol.

## 2021-07-26 NOTE — ED Notes (Signed)
Went over dc papers. All questions answered. Ambulatory to lobby .  

## 2021-07-31 DIAGNOSIS — Z20822 Contact with and (suspected) exposure to covid-19: Secondary | ICD-10-CM | POA: Diagnosis not present

## 2021-11-16 DIAGNOSIS — J020 Streptococcal pharyngitis: Secondary | ICD-10-CM | POA: Diagnosis not present

## 2022-01-11 ENCOUNTER — Other Ambulatory Visit: Payer: Self-pay

## 2022-01-11 ENCOUNTER — Telehealth: Payer: Self-pay

## 2022-01-11 DIAGNOSIS — Z30011 Encounter for initial prescription of contraceptive pills: Secondary | ICD-10-CM

## 2022-01-11 MED ORDER — NORGESTIMATE-ETH ESTRADIOL 0.25-35 MG-MCG PO TABS: 1.0000 | ORAL_TABLET | Freq: Every day | ORAL | 0 refills | Status: DC

## 2022-01-11 NOTE — Telephone Encounter (Signed)
Pt called triage needing refill on OCP's, but she is overdue for annual. She has annual scheduled now. So I will refill OCP's, then she can get more when she comes in for annual. Pt aware  ?

## 2022-01-11 NOTE — Telephone Encounter (Signed)
Pt calling triage needing refill on OCP's, but shes overdue for annual. Can you help schedule this with her? Then I will send in refills ?

## 2022-01-12 ENCOUNTER — Telehealth: Payer: Self-pay

## 2022-01-12 ENCOUNTER — Other Ambulatory Visit: Payer: Self-pay | Admitting: Obstetrics and Gynecology

## 2022-01-12 DIAGNOSIS — Z30011 Encounter for initial prescription of contraceptive pills: Secondary | ICD-10-CM

## 2022-01-12 NOTE — Telephone Encounter (Signed)
Returning pt triage phone call. Sent in refill to last to appt. Please let meknow when she calls back or let her know her RX has been sent int ?

## 2022-02-12 NOTE — Progress Notes (Deleted)
   PCP:  Patient, No Pcp Per (Inactive)   No chief complaint on file.    HPI:      [name] is a 26 y.o. [id number] whose LMP was No LMP recorded. (Menstrual status: Oral contraceptives)., presents today for her annual examination.  Her menses are {norm/abn:715}, lasting {number: 22536} days.  Dysmenorrhea {dysmen:716}. She {does:18564} have intermenstrual bleeding.  Sex activity: {sex active: 315163}.  Last Pap: 11/26/20 Results were: no abnormalities  Hx of STDs: {STD hx:14358}  There is no FH of breast cancer. There is no FH of ovarian cancer. The patient {does:18564} do self-breast exams.  Tobacco use: {tob:20664} Alcohol use: {Alcohol:11675} No drug use.  Exercise: {exercise:31265}  She {does:18564} get adequate calcium and Vitamin D in her diet.  There are no problems to display for this patient.   Past Surgical History:  Procedure Laterality Date   NO PAST SURGERIES      Family History  Problem Relation Age of Onset   Breast cancer Paternal Grandmother        57s   Breast cancer Paternal Aunt        33s    Social History   Socioeconomic History   Marital status: Married    Spouse name: Not on file   Number of children: Not on file   Years of education: Not on file   Highest education level: Not on file  Occupational History   Not on file  Tobacco Use   Smoking status: Never   Smokeless tobacco: Never  Vaping Use   Vaping Use: Never used  Substance and Sexual Activity   Alcohol use: No   Drug use: No   Sexual activity: Yes    Birth control/protection: Pill  Other Topics Concern   Not on file  Social History Narrative   Not on file   Social Determinants of Health   Financial Resource Strain: Not on file  Food Insecurity: Not on file  Transportation Needs: Not on file  Physical Activity: Not on file  Stress: Not on file  Social Connections: Not on file  Intimate Partner Violence: Not on file     Current Outpatient Medications:     norgestimate-ethinyl estradiol (ORTHO-CYCLEN) 0.25-35 MG-MCG tablet, Take 1 tablet by mouth daily., Disp: 90 tablet, Rfl: 0     ROS:  Review of Systems BREAST: No symptoms   Objective: There were no vitals taken for this visit.   OBGyn Exam  Results: No results found for this or any previous visit (from the past 24 hour(s)).  Assessment/Plan: No diagnosis found.  No orders of the defined types were placed in this encounter.            GYN counsel {counseling: 16159}     F/U  No follow-ups on file.  Alexis Ward B. Alexis Castrogiovanni, PA-C 02/12/2022 2:21 PM

## 2022-02-13 ENCOUNTER — Ambulatory Visit: Payer: BC Managed Care – PPO | Admitting: Obstetrics and Gynecology

## 2022-04-07 ENCOUNTER — Other Ambulatory Visit: Payer: Self-pay | Admitting: Obstetrics and Gynecology

## 2022-04-07 DIAGNOSIS — Z30011 Encounter for initial prescription of contraceptive pills: Secondary | ICD-10-CM

## 2022-04-10 ENCOUNTER — Other Ambulatory Visit: Payer: Self-pay | Admitting: Obstetrics and Gynecology

## 2022-04-10 ENCOUNTER — Encounter: Payer: Self-pay | Admitting: Obstetrics and Gynecology

## 2022-04-10 DIAGNOSIS — Z30011 Encounter for initial prescription of contraceptive pills: Secondary | ICD-10-CM

## 2022-04-10 NOTE — Telephone Encounter (Signed)
Patient called triage line requesting a refill for her birth control.

## 2022-04-10 NOTE — Telephone Encounter (Signed)
Called patient back to verify her pharmacy left voicemail to return my call.

## 2022-04-11 ENCOUNTER — Other Ambulatory Visit: Payer: Self-pay | Admitting: Obstetrics and Gynecology

## 2022-04-11 ENCOUNTER — Other Ambulatory Visit: Payer: Self-pay

## 2022-04-11 DIAGNOSIS — Z30011 Encounter for initial prescription of contraceptive pills: Secondary | ICD-10-CM

## 2022-04-11 MED ORDER — NORGESTIMATE-ETH ESTRADIOL 0.25-35 MG-MCG PO TABS: 1.0000 | ORAL_TABLET | Freq: Every day | ORAL | 0 refills | Status: DC

## 2022-04-13 ENCOUNTER — Other Ambulatory Visit: Payer: Self-pay

## 2022-04-13 NOTE — Telephone Encounter (Signed)
Pt calling for refill on bc; has appt for f/u 04/25/22.  [phone]  Adv pt refill eRx'd on the 18th.

## 2022-04-25 ENCOUNTER — Ambulatory Visit: Payer: BC Managed Care – PPO | Admitting: Obstetrics and Gynecology

## 2022-06-03 ENCOUNTER — Other Ambulatory Visit: Payer: Self-pay | Admitting: Obstetrics and Gynecology

## 2022-06-03 DIAGNOSIS — Z30011 Encounter for initial prescription of contraceptive pills: Secondary | ICD-10-CM

## 2022-06-05 ENCOUNTER — Other Ambulatory Visit: Payer: Self-pay | Admitting: Obstetrics and Gynecology

## 2022-06-05 ENCOUNTER — Encounter: Payer: Self-pay | Admitting: Obstetrics and Gynecology

## 2022-06-05 DIAGNOSIS — Z30011 Encounter for initial prescription of contraceptive pills: Secondary | ICD-10-CM

## 2022-06-06 MED ORDER — NORGESTIMATE-ETH ESTRADIOL 0.25-35 MG-MCG PO TABS: 1.0000 | ORAL_TABLET | Freq: Every day | ORAL | 0 refills | Status: DC

## 2022-06-13 ENCOUNTER — Encounter: Payer: Self-pay | Admitting: Obstetrics and Gynecology

## 2022-06-13 ENCOUNTER — Ambulatory Visit (INDEPENDENT_AMBULATORY_CARE_PROVIDER_SITE_OTHER): Payer: BC Managed Care – PPO | Admitting: Obstetrics and Gynecology

## 2022-06-13 VITALS — BP 110/70 | Ht 62.0 in | Wt 162.0 lb

## 2022-06-13 DIAGNOSIS — Z803 Family history of malignant neoplasm of breast: Secondary | ICD-10-CM

## 2022-06-13 DIAGNOSIS — Z3041 Encounter for surveillance of contraceptive pills: Secondary | ICD-10-CM

## 2022-06-13 DIAGNOSIS — Z01419 Encounter for gynecological examination (general) (routine) without abnormal findings: Secondary | ICD-10-CM

## 2022-06-13 MED ORDER — NORGESTIMATE-ETH ESTRADIOL 0.25-35 MG-MCG PO TABS: 1.0000 | ORAL_TABLET | Freq: Every day | ORAL | 3 refills | Status: DC

## 2022-06-13 NOTE — Patient Instructions (Signed)
I value your feedback and you entrusting us with your care. If you get a [facility] patient survey, I would appreciate you taking the time to let us know about your experience today. Thank you! ? ? ?

## 2022-06-13 NOTE — Progress Notes (Signed)
PCP:  Patient, No Pcp Per   Chief Complaint  Patient presents with   Gynecologic Exam    Cycle is late this month, neg UPT's at home     HPI:      [name] is a 26 y.o. [id number] whose LMP was Patient's last menstrual period was 05/01/2022 (exact date)., presents today for her annual examination.  Her menses are regular every 28-30 days, lasting 5-7 days.  Dysmenorrhea mild, improved with NSAIDs. She does not have intermenstrual bleeding. Menses late this month with neg UPTs, most recently 2 days ago. Did have late pill this cycle.  Sex activity: single partner, contraception - OCP (estrogen/progesterone). No pain/bleeding. Last Pap: 11/26/20  Results were: no abnormalities   There is a FH of breast cancer in her pat aunt and PGM. Pt is MyRisk neg, IBIS=19%/riskscore=12%. There is no FH of ovarian cancer. The patient does do self-breast exams.  Tobacco use: The patient denies current or previous tobacco use. Alcohol use: none No drug use.  Exercise: moderately active  She does get adequate calcium but not Vitamin D in her diet.  Past Medical History:  Diagnosis Date   BRCA negative 11/2020   MyRisk neg   Family history of breast cancer 11/2020   IBIS=19%/riskscore=12%   Kidney stone    Renal disorder      Past Surgical History:  Procedure Laterality Date   NO PAST SURGERIES      Family History  Problem Relation Age of Onset   Breast cancer Paternal Grandmother        36s   Breast cancer Paternal Aunt        38s    Social History   Socioeconomic History   Marital status: Married    Spouse name: Not on file   Number of children: Not on file   Years of education: Not on file   Highest education level: Not on file  Occupational History   Not on file  Tobacco Use   Smoking status: Never   Smokeless tobacco: Never  Vaping Use   Vaping Use: Never used  Substance and Sexual Activity   Alcohol use: No   Drug use: No   Sexual activity: Yes     Birth control/protection: Pill  Other Topics Concern   Not on file  Social History Narrative   Not on file   Social Determinants of Health   Financial Resource Strain: Not on file  Food Insecurity: Not on file  Transportation Needs: Not on file  Physical Activity: Not on file  Stress: Not on file  Social Connections: Not on file  Intimate Partner Violence: Not on file     Current Outpatient Medications:    norgestimate-ethinyl estradiol (ORTHO-CYCLEN) 0.25-35 MG-MCG tablet, Take 1 tablet by mouth daily., Disp: 84 tablet, Rfl: 3     ROS:  Review of Systems  Constitutional:  Negative for fatigue, fever and unexpected weight change.  Respiratory:  Negative for cough, shortness of breath and wheezing.   Cardiovascular:  Negative for chest pain, palpitations and leg swelling.  Gastrointestinal:  Negative for blood in stool, constipation, diarrhea, nausea and vomiting.  Endocrine: Negative for cold intolerance, heat intolerance and polyuria.  Genitourinary:  Negative for dyspareunia, dysuria, flank pain, frequency, genital sores, hematuria, menstrual problem, pelvic pain, urgency, vaginal bleeding, vaginal discharge and vaginal pain.  Musculoskeletal:  Negative for back pain, joint swelling and myalgias.  Skin:  Negative for rash.  Neurological:  Negative for dizziness,  syncope, light-headedness, numbness and headaches.  Hematological:  Negative for adenopathy.  Psychiatric/Behavioral:  Negative for agitation, confusion, sleep disturbance and suicidal ideas. The patient is not nervous/anxious.    BREAST: No symptoms   Objective: BP 110/70   Ht 5' 2"  (1.575 m)   Wt 162 lb (73.5 kg)   LMP 05/01/2022 (Exact Date)   BMI 29.63 kg/m    Physical Exam Constitutional:      Appearance: She is well-developed.  Genitourinary:     Vulva normal.     Right Labia: No rash, tenderness or lesions.    Left Labia: No tenderness, lesions or rash.    No vaginal discharge, erythema or  tenderness.      Right Adnexa: not tender and no mass present.    Left Adnexa: not tender and no mass present.    No cervical friability or polyp.     Uterus is not enlarged or tender.  Breasts:    Right: No mass, nipple discharge, skin change or tenderness.     Left: No mass, nipple discharge, skin change or tenderness.  Neck:     Thyroid: No thyromegaly.  Cardiovascular:     Rate and Rhythm: Normal rate and regular rhythm.     Heart sounds: Normal heart sounds. No murmur heard. Pulmonary:     Effort: Pulmonary effort is normal.     Breath sounds: Normal breath sounds.  Abdominal:     Palpations: Abdomen is soft.     Tenderness: There is no abdominal tenderness. There is no guarding or rebound.  Musculoskeletal:        General: Normal range of motion.     Cervical back: Normal range of motion.  Lymphadenopathy:     Cervical: No cervical adenopathy.  Neurological:     General: No focal deficit present.     Mental Status: She is alert and oriented to person, place, and time.     Cranial Nerves: No cranial nerve deficit.  Skin:    General: Skin is warm and dry.  Psychiatric:        Mood and Affect: Mood normal.        Behavior: Behavior normal.        Thought Content: Thought content normal.        Judgment: Judgment normal.  Vitals reviewed.    Assessment/Plan: Encounter for annual routine gynecological examination  Encounter for surveillance of contraceptive pills - Plan: norgestimate-ethinyl estradiol (ORTHO-CYCLEN) 0.25-35 MG-MCG tablet; OCP RF  Family history of breast cancer--pt is MyRisk neg, no addl screening at this time  Meds ordered this encounter  Medications   norgestimate-ethinyl estradiol (ORTHO-CYCLEN) 0.25-35 MG-MCG tablet    Sig: Take 1 tablet by mouth daily.    Dispense:  84 tablet    Refill:  3    Order Specific Question:   Supervising Provider    Answer:   Renaldo Reel             GYN counsel adequate intake of calcium and vitamin  D, diet and exercise     F/U  Return in about 1 year (around 06/14/2023).  Aubrey Blackard B. Egon Dittus, PA-C 06/13/2022 3:50 PM

## 2022-10-03 ENCOUNTER — Other Ambulatory Visit: Payer: Self-pay

## 2022-10-03 ENCOUNTER — Emergency Department (HOSPITAL_COMMUNITY)
Admission: EM | Admit: 2022-10-03 | Discharge: 2022-10-03 | Disposition: A | Discharge status: Home / Self Care | Payer: BC Managed Care – PPO | Attending: Student | Admitting: Student

## 2022-10-03 ENCOUNTER — Emergency Department (HOSPITAL_COMMUNITY): Payer: BC Managed Care – PPO

## 2022-10-03 DIAGNOSIS — D7389 Other diseases of spleen: Secondary | ICD-10-CM | POA: Diagnosis not present

## 2022-10-03 DIAGNOSIS — J101 Influenza due to other identified influenza virus with other respiratory manifestations: Secondary | ICD-10-CM | POA: Diagnosis not present

## 2022-10-03 DIAGNOSIS — R1012 Left upper quadrant pain: Secondary | ICD-10-CM | POA: Diagnosis not present

## 2022-10-03 DIAGNOSIS — N200 Calculus of kidney: Secondary | ICD-10-CM | POA: Diagnosis not present

## 2022-10-03 DIAGNOSIS — Z1152 Encounter for screening for COVID-19: Secondary | ICD-10-CM | POA: Insufficient documentation

## 2022-10-03 DIAGNOSIS — R509 Fever, unspecified: Secondary | ICD-10-CM | POA: Diagnosis not present

## 2022-10-03 LAB — URINALYSIS, ROUTINE W REFLEX MICROSCOPIC
Bacteria, UA: NONE SEEN
Bilirubin Urine: NEGATIVE
Glucose, UA: NEGATIVE mg/dL
Ketones, ur: NEGATIVE mg/dL
Leukocytes,Ua: NEGATIVE
Nitrite: NEGATIVE
Protein, ur: 30 mg/dL — AB
Specific Gravity, Urine: 1.027 (ref 1.005–1.030)
pH: 5 (ref 5.0–8.0)

## 2022-10-03 LAB — COMPREHENSIVE METABOLIC PANEL
ALT: 13 U/L (ref 0–44)
AST: 13 U/L — ABNORMAL LOW (ref 15–41)
Albumin: 3.8 g/dL (ref 3.5–5.0)
Alkaline Phosphatase: 40 U/L (ref 38–126)
Anion gap: 8 (ref 5–15)
BUN: 19 mg/dL (ref 6–20)
CO2: 23 mmol/L (ref 22–32)
Calcium: 8.7 mg/dL — ABNORMAL LOW (ref 8.9–10.3)
Chloride: 104 mmol/L (ref 98–111)
Creatinine, Ser: 0.65 mg/dL (ref 0.44–1.00)
GFR, Estimated: >60 mL/min (ref >60)
Glucose, Bld: 103 mg/dL — ABNORMAL HIGH (ref 70–99)
Potassium: 4 mmol/L (ref 3.5–5.1)
Sodium: 135 mmol/L (ref 135–145)
Total Bilirubin: 0.3 mg/dL (ref 0.3–1.2)
Total Protein: 6.9 g/dL (ref 6.5–8.1)

## 2022-10-03 LAB — RESP PANEL BY RT-PCR (RSV, FLU A&B, COVID)  RVPGX2
Influenza A by PCR: POSITIVE — AB
Influenza B by PCR: NEGATIVE
Resp Syncytial Virus by PCR: NEGATIVE
SARS Coronavirus 2 by RT PCR: NEGATIVE

## 2022-10-03 LAB — CBC WITH DIFFERENTIAL/PLATELET
Abs Immature Granulocytes: 0.01 10*3/uL (ref 0.00–0.07)
Basophils Absolute: 0 10*3/uL (ref 0.0–0.1)
Basophils Relative: 1 %
Eosinophils Absolute: 0 10*3/uL (ref 0.0–0.5)
Eosinophils Relative: 0 %
HCT: 38.2 % (ref 36.0–46.0)
Hemoglobin: 12.5 g/dL (ref 12.0–15.0)
Immature Granulocytes: 0 %
Lymphocytes Relative: 6 %
Lymphs Abs: 0.3 10*3/uL — ABNORMAL LOW (ref 0.7–4.0)
MCH: 29.3 pg (ref 26.0–34.0)
MCHC: 32.7 g/dL (ref 30.0–36.0)
MCV: 89.5 fL (ref 80.0–100.0)
Monocytes Absolute: 0.4 10*3/uL (ref 0.1–1.0)
Monocytes Relative: 7 %
Neutro Abs: 5.1 10*3/uL (ref 1.7–7.7)
Neutrophils Relative %: 86 %
Platelets: 212 10*3/uL (ref 150–400)
RBC: 4.27 MIL/uL (ref 3.87–5.11)
RDW: 13.3 % (ref 11.5–15.5)
WBC: 5.9 10*3/uL (ref 4.0–10.5)
nRBC: 0 % (ref 0.0–0.2)

## 2022-10-03 LAB — PREGNANCY, URINE: Preg Test, Ur: NEGATIVE

## 2022-10-03 LAB — LIPASE, BLOOD: Lipase: 27 U/L (ref 11–51)

## 2022-10-03 MED ORDER — ONDANSETRON HCL 4 MG PO TABS: 4.0000 mg | ORAL_TABLET | Freq: Four times a day (QID) | ORAL | 0 refills | Status: DC

## 2022-10-03 MED ORDER — KETOROLAC TROMETHAMINE 15 MG/ML IJ SOLN
15.0000 mg | Freq: Once | INTRAMUSCULAR | Status: AC
  Administered 2022-10-03: 15 mg via INTRAMUSCULAR
  Filled 2022-10-03: qty 1

## 2022-10-03 MED ORDER — ONDANSETRON HCL 4 MG/2ML IJ SOLN
4.0000 mg | Freq: Once | INTRAMUSCULAR | Status: AC
  Administered 2022-10-03: 4 mg via INTRAVENOUS
  Filled 2022-10-03: qty 2

## 2022-10-03 MED ORDER — ACETAMINOPHEN 325 MG PO TABS
650.0000 mg | ORAL_TABLET | Freq: Once | ORAL | Status: AC
  Administered 2022-10-03: 650 mg via ORAL
  Filled 2022-10-03: qty 2

## 2022-10-03 NOTE — ED Triage Notes (Signed)
Pt c/o abd pain, vomiting, cough, headache since last night. Family member tested positive for the flu.

## 2022-10-03 NOTE — ED Notes (Signed)
Patient transported to CT 

## 2022-10-03 NOTE — Discharge Instructions (Addendum)
Seen today for body aches and chills and left-sided abdominal pain.  You have influenza this was likely the cause of most of your symptoms.  We did a CT scan to rule out kidney stone causing obstruction that could be causing your left-sided abdominal pain.  You have some small stones in your kidneys but no obstructing stones, follow-up close with your primary care doctor come back for worsening abdominal pain.  You are given prescription for Zofran for nausea, drink plenty of fluids and rest,   Primary Care Doctor List    Tula Nakayama, MD. Specialty: [facility] Contact information: [address], [address] [address]  [phone]   Sallee Lange, MD. Specialty: [facility] Contact information: [place] [address]  [phone]   Rosita Fire, MD Specialty: Internal Medicine Contact information: [place] [address]  [phone]   Delphina Cahill, MD. Specialty: Internal Medicine Contact information: [place] [address]  [phone]    [facility] (Dr. Maudie Mercury) Specialty: Family Medicine Contact information: [place] [address]  [phone]   Leslie Andrea, MD. Specialty: [facility] Contact information: [place] [address]  [phone]   Asencion Noble, MD. Specialty: Internal Medicine Contact information: [address]  [place] [address]  [place]  [address] [place], [place] [address] [phone]  Services [facility] offers a variety of basic health services.  Services include but are not limited to: Blood pressure checks  Heart rate checks  Blood sugar checks  Urine analysis  Rapid strep tests  Pregnancy tests.  Health education and referrals  People needing more complex services  will be directed to a physician online. Using these virtual visits, doctors can evaluate and prescribe medicine and treatments. There will be no medication on-site, though [facility] will help patients fill their prescriptions at little to no cost.   For More information please go to: [email or web address]

## 2022-10-03 NOTE — ED Provider Notes (Signed)
[facility] EMERGENCY DEPARTMENT Provider Note   CSN: [id number] Arrival date & time: 10/03/22  6269     History  Chief Complaint  Patient presents with   Abdominal Pain    Alexis Ward is a 27 y.o. female.  She has no chronic medical problems.  She presents the ER with complaints of fever, body aches, nausea and vomiting started suddenly last night.  Her urine 32-year-old daughter was diagnosed with flu a recently.  She reports some intermittent cramping in her epigastric area that goes to her left upper quadrant and left upper back.  She denies any chest pain, shortness of breath.   Abdominal Pain      Home Medications Prior to Admission medications   Medication Sig Start Date End Date Taking? Authorizing Provider  ondansetron (ZOFRAN) 4 MG tablet Take 1 tablet (4 mg total) by mouth every 6 (six) hours. 10/03/22  Yes Pauletta Pickney A, PA-C  norgestimate-ethinyl estradiol (ORTHO-CYCLEN) 0.25-35 MG-MCG tablet Take 1 tablet by mouth daily. 06/13/22   Copland, Ilona Sorrel, PA-C      Allergies    Patient has no known allergies.    Review of Systems   Review of Systems  Gastrointestinal:  Positive for abdominal pain.    Physical Exam Updated Vital Signs BP 137/81 (BP Location: Right Arm)   Pulse 88   Temp 98.3 F (36.8 C) (Oral)   Resp 17   Ht 5\' 2"  (1.575 m)   Wt 81.6 kg   LMP 10/01/2022 (Approximate)   SpO2 99%   BMI 32.92 kg/m  Physical Exam Vitals and nursing note reviewed.  Constitutional:      General: She is not in acute distress.    Appearance: She is well-developed.  HENT:     Head: Normocephalic and atraumatic.  Eyes:     Conjunctiva/sclera: Conjunctivae normal.  Cardiovascular:     Rate and Rhythm: Normal rate and regular rhythm.     Heart sounds: No murmur heard. Pulmonary:     Effort: Pulmonary effort is normal. No respiratory distress.     Breath sounds: Normal breath sounds.  Abdominal:     General: Bowel sounds are normal.     Palpations:  Abdomen is soft.     Tenderness: There is no abdominal tenderness. There is no right CVA tenderness, left CVA tenderness, guarding or rebound. Negative signs include Murphy's sign, Rovsing's sign and McBurney's sign.  Musculoskeletal:        General: No swelling.     Cervical back: Neck supple.  Skin:    General: Skin is warm and dry.     Capillary Refill: Capillary refill takes less than 2 seconds.  Neurological:     Mental Status: She is alert.  Psychiatric:        Mood and Affect: Mood normal.     ED Results / Procedures / Treatments   Labs (all labs ordered are listed, but only abnormal results are displayed) Labs Reviewed  RESP PANEL BY RT-PCR (RSV, FLU A&B, COVID)  RVPGX2 - Abnormal; Notable for the following components:      Result Value   Influenza A by PCR POSITIVE (*)    All other components within normal limits  CBC WITH DIFFERENTIAL/PLATELET - Abnormal; Notable for the following components:   Lymphs Abs 0.3 (*)    All other components within normal limits  COMPREHENSIVE METABOLIC PANEL - Abnormal; Notable for the following components:   Glucose, Bld 103 (*)    Calcium 8.7 (*)  AST 13 (*)    All other components within normal limits  URINALYSIS, ROUTINE W REFLEX MICROSCOPIC - Abnormal; Notable for the following components:   Hgb urine dipstick MODERATE (*)    Protein, ur 30 (*)    All other components within normal limits  LIPASE, BLOOD  PREGNANCY, URINE    EKG None  Radiology CT Renal Stone Study  Result Date: 10/03/2022 CLINICAL DATA:  Acute left-sided abdominal pain. EXAM: CT ABDOMEN AND PELVIS WITHOUT CONTRAST TECHNIQUE: Multidetector CT imaging of the abdomen and pelvis was performed following the standard protocol without IV contrast. RADIATION DOSE REDUCTION: This exam was performed according to the departmental dose-optimization program which includes automated exposure control, adjustment of the mA and/or kV according to patient size and/or use of  iterative reconstruction technique. COMPARISON:  July 08, 2013. FINDINGS: Lower chest: No acute abnormality. Hepatobiliary: No focal liver abnormality is seen. No gallstones, gallbladder wall thickening, or biliary dilatation. Pancreas: Unremarkable. No pancreatic ductal dilatation or surrounding inflammatory changes. Spleen: Multiple calcified splenic granulomas are noted. Adrenals/Urinary Tract: Adrenal glands appear normal. Minimal bilateral nephrolithiasis. No hydronephrosis or renal obstruction is noted. Urinary bladder is unremarkable. Stomach/Bowel: Stomach is within normal limits. Appendix appears normal. No evidence of bowel wall thickening, distention, or inflammatory changes. Vascular/Lymphatic: No significant vascular findings are present. No enlarged abdominal or pelvic lymph nodes. Reproductive: Uterus and bilateral adnexa are unremarkable. Other: No abdominal wall hernia or abnormality. No abdominopelvic ascites. Musculoskeletal: No acute or significant osseous findings. IMPRESSION: Minimal bilateral nonobstructive nephrolithiasis. No hydronephrosis or renal obstruction is noted. No other abnormality seen in the abdomen or pelvis. Electronically Signed   By: Marijo Conception M.D.   On: 10/03/2022 13:37    Procedures Procedures    Medications Ordered in ED Medications  ondansetron (ZOFRAN) injection 4 mg (4 mg Intravenous Given 10/03/22 1032)  acetaminophen (TYLENOL) tablet 650 mg (650 mg Oral Given 10/03/22 1019)  ketorolac (TORADOL) 15 MG/ML injection 15 mg (15 mg Intramuscular Given 10/03/22 1122)    ED Course/ Medical Decision Making/ A&P                           Medical Decision Making This patient presents to the ED for concern of bodyaches chills, headache, fever and vomiting, this involves an extensive number of treatment options, and is a complaint that carries with it a high risk of complications and morbidity.  The differential diagnosis includes, gastritis, gastroenteritis,  COVID-19, other     Additional history obtained:  Additional history obtained from EMR External records from outside source obtained and reviewed including outpatient gynecology visits   Lab Tests:  I Ordered, and personally interpreted labs.  The pertinent results include: Positive  influenza A   Imaging Studies ordered:  I ordered imaging studies including CT abdomen pelvis I independently visualized and interpreted imaging which showed small intrarenal stones no obstructive uropathy, no acute abnormality I agree with the radiologist interpretation      Problem List / ED Course / Critical interventions / Medication management Influenza A-patient's symptoms likely due to her influenza, she had exposure at home to her daughter.  She was also having some left upper abdominal pain and flank pain with history of kidney stones she did have some blood in her urine possibly due to recently having stopped her menstrual period she has no urinary symptoms.  Discussed risk and benefits of a CT with her.  Given that this upper abdominal pain  does not fit with her influenza she was agreeable with undergoing CT to rule out stone or other acute abnormality.  This was normal.  She is feeling much better after Toradol Zofran and Tylenol.  She is able to tolerate p.o. fluids.  I did discuss with her possibly giving him a flu as she was within the window that she has no asthma or other respiratory or chronic illnesses.  She is agreeable with foregoing this for now and continue with supportive care at home.  She is given work note as well.  Advised on follow-up and strict return precautions. Body aches, left upper abdomen and flank pain I ordered medication including Toradol for pain  Reevaluation of the patient after these medicines showed that the patient improved I have reviewed the patients home medicines and have made adjustments as needed      Amount and/or Complexity of Data Reviewed Labs:  ordered.           Final Clinical Impression(s) / ED Diagnoses Final diagnoses:  Influenza A  Left upper quadrant abdominal pain    Rx / DC Orders ED Discharge Orders          Ordered    Urine Culture        10/03/22 1114    CT RENAL STONE STUDY  Status:  Canceled        10/03/22 1114    ondansetron (ZOFRAN) 4 MG tablet  Every 6 hours        10/03/22 1352              Ma Rings, PA-C 10/03/22 1739    Kommor, Wyn Forster, MD 10/03/22 2018

## 2022-10-04 LAB — URINE CULTURE: Culture: NO GROWTH

## 2023-01-26 DIAGNOSIS — Z111 Encounter for screening for respiratory tuberculosis: Secondary | ICD-10-CM | POA: Diagnosis not present

## 2023-02-13 DIAGNOSIS — F1520 Other stimulant dependence, uncomplicated: Secondary | ICD-10-CM | POA: Diagnosis not present

## 2023-02-13 DIAGNOSIS — L301 Dyshidrosis [pompholyx]: Secondary | ICD-10-CM | POA: Diagnosis not present

## 2023-04-20 DIAGNOSIS — J069 Acute upper respiratory infection, unspecified: Secondary | ICD-10-CM | POA: Diagnosis not present

## 2023-04-21 DIAGNOSIS — B349 Viral infection, unspecified: Secondary | ICD-10-CM | POA: Diagnosis not present

## 2023-04-21 DIAGNOSIS — R112 Nausea with vomiting, unspecified: Secondary | ICD-10-CM | POA: Diagnosis not present

## 2023-04-21 DIAGNOSIS — R519 Headache, unspecified: Secondary | ICD-10-CM | POA: Diagnosis not present

## 2023-04-21 DIAGNOSIS — R1030 Lower abdominal pain, unspecified: Secondary | ICD-10-CM | POA: Diagnosis not present

## 2023-04-23 ENCOUNTER — Encounter (HOSPITAL_COMMUNITY): Payer: Self-pay

## 2023-04-23 ENCOUNTER — Emergency Department (HOSPITAL_COMMUNITY): Payer: BC Managed Care – PPO

## 2023-04-23 ENCOUNTER — Other Ambulatory Visit: Payer: Self-pay

## 2023-04-23 ENCOUNTER — Emergency Department (HOSPITAL_COMMUNITY)
Admission: EM | Admit: 2023-04-23 | Discharge: 2023-04-23 | Disposition: A | Discharge status: Home / Self Care | Payer: BC Managed Care – PPO | Attending: Emergency Medicine | Admitting: Emergency Medicine

## 2023-04-23 DIAGNOSIS — N3000 Acute cystitis without hematuria: Secondary | ICD-10-CM | POA: Insufficient documentation

## 2023-04-23 DIAGNOSIS — R911 Solitary pulmonary nodule: Secondary | ICD-10-CM | POA: Diagnosis not present

## 2023-04-23 DIAGNOSIS — D72819 Decreased white blood cell count, unspecified: Secondary | ICD-10-CM | POA: Diagnosis not present

## 2023-04-23 DIAGNOSIS — R748 Abnormal levels of other serum enzymes: Secondary | ICD-10-CM | POA: Diagnosis not present

## 2023-04-23 DIAGNOSIS — R935 Abnormal findings on diagnostic imaging of other abdominal regions, including retroperitoneum: Secondary | ICD-10-CM | POA: Diagnosis not present

## 2023-04-23 DIAGNOSIS — K429 Umbilical hernia without obstruction or gangrene: Secondary | ICD-10-CM | POA: Diagnosis not present

## 2023-04-23 DIAGNOSIS — R300 Dysuria: Secondary | ICD-10-CM | POA: Diagnosis not present

## 2023-04-23 DIAGNOSIS — R1032 Left lower quadrant pain: Secondary | ICD-10-CM | POA: Diagnosis not present

## 2023-04-23 DIAGNOSIS — R918 Other nonspecific abnormal finding of lung field: Secondary | ICD-10-CM

## 2023-04-23 DIAGNOSIS — N200 Calculus of kidney: Secondary | ICD-10-CM | POA: Diagnosis not present

## 2023-04-23 LAB — COMPREHENSIVE METABOLIC PANEL
ALT: 395 U/L — ABNORMAL HIGH (ref 0–44)
AST: 302 U/L — ABNORMAL HIGH (ref 15–41)
Albumin: 3.3 g/dL — ABNORMAL LOW (ref 3.5–5.0)
Alkaline Phosphatase: 71 U/L (ref 38–126)
Anion gap: 3 — ABNORMAL LOW (ref 5–15)
BUN: 12 mg/dL (ref 6–20)
CO2: 24 mmol/L (ref 22–32)
Calcium: 8.1 mg/dL — ABNORMAL LOW (ref 8.9–10.3)
Chloride: 106 mmol/L (ref 98–111)
Creatinine, Ser: 0.63 mg/dL (ref 0.44–1.00)
GFR, Estimated: >60 mL/min (ref >60)
Glucose, Bld: 83 mg/dL (ref 70–99)
Potassium: 3.5 mmol/L (ref 3.5–5.1)
Sodium: 133 mmol/L — ABNORMAL LOW (ref 135–145)
Total Bilirubin: 1.1 mg/dL (ref 0.3–1.2)
Total Protein: 6.4 g/dL — ABNORMAL LOW (ref 6.5–8.1)

## 2023-04-23 LAB — URINALYSIS, ROUTINE W REFLEX MICROSCOPIC
Bilirubin Urine: NEGATIVE
Glucose, UA: NEGATIVE mg/dL
Hgb urine dipstick: NEGATIVE
Ketones, ur: NEGATIVE mg/dL
Nitrite: NEGATIVE
Protein, ur: 100 mg/dL — AB
Specific Gravity, Urine: 1.038 — ABNORMAL HIGH (ref 1.005–1.030)
pH: 5 (ref 5.0–8.0)

## 2023-04-23 LAB — CBC WITH DIFFERENTIAL/PLATELET
Abs Immature Granulocytes: 0 10*3/uL (ref 0.00–0.07)
Basophils Absolute: 0 10*3/uL (ref 0.0–0.1)
Basophils Relative: 0 %
Eosinophils Absolute: 0 10*3/uL (ref 0.0–0.5)
Eosinophils Relative: 1 %
HCT: 36.5 % (ref 36.0–46.0)
Hemoglobin: 12 g/dL (ref 12.0–15.0)
Lymphocytes Relative: 22 %
Lymphs Abs: 0.7 10*3/uL (ref 0.7–4.0)
MCH: 28.2 pg (ref 26.0–34.0)
MCHC: 32.9 g/dL (ref 30.0–36.0)
MCV: 85.9 fL (ref 80.0–100.0)
Monocytes Absolute: 0.2 10*3/uL (ref 0.1–1.0)
Monocytes Relative: 5 %
Neutro Abs: 2.2 10*3/uL (ref 1.7–7.7)
Neutrophils Relative %: 72 %
Platelets: 98 10*3/uL — ABNORMAL LOW (ref 150–400)
RBC: 4.25 MIL/uL (ref 3.87–5.11)
RDW: 12.9 % (ref 11.5–15.5)
WBC: 3 10*3/uL — ABNORMAL LOW (ref 4.0–10.5)
nRBC: 0 % (ref 0.0–0.2)

## 2023-04-23 LAB — MAGNESIUM: Magnesium: 1.8 mg/dL (ref 1.7–2.4)

## 2023-04-23 LAB — PREGNANCY, URINE: Preg Test, Ur: NEGATIVE

## 2023-04-23 LAB — LIPASE, BLOOD: Lipase: 24 U/L (ref 11–51)

## 2023-04-23 MED ORDER — KETOROLAC TROMETHAMINE 30 MG/ML IJ SOLN
30.0000 mg | Freq: Once | INTRAMUSCULAR | Status: AC
  Administered 2023-04-23: 30 mg via INTRAVENOUS
  Filled 2023-04-23: qty 1

## 2023-04-23 MED ORDER — OXYCODONE HCL 5 MG PO TABS
5.0000 mg | ORAL_TABLET | Freq: Once | ORAL | Status: AC
  Administered 2023-04-23: 5 mg via ORAL
  Filled 2023-04-23: qty 1

## 2023-04-23 MED ORDER — SODIUM CHLORIDE 0.9 % IV BOLUS
1000.0000 mL | Freq: Once | INTRAVENOUS | Status: AC
  Administered 2023-04-23: 1000 mL via INTRAVENOUS

## 2023-04-23 MED ORDER — AMOXICILLIN-POT CLAVULANATE 875-125 MG PO TABS: 1.0000 | ORAL_TABLET | Freq: Two times a day (BID) | ORAL | 0 refills | Status: DC

## 2023-04-23 MED ORDER — OXYCODONE HCL 5 MG PO TABS: 5.0000 mg | ORAL_TABLET | Freq: Four times a day (QID) | ORAL | 0 refills | Status: DC | PRN

## 2023-04-23 NOTE — Discharge Instructions (Signed)
Your workup today shows that you have a likely urinary tract infection.  You have been prescribed antibiotics to take for this.  Please take as directed until finished.  The CT you had today also showed some nodules of your lungs that may be infectious.  Recommend that you have repeat CT scan or x-ray of your chest in 2 weeks to ensure the nodules have improved or gone away.  Your primary care provider can arrange this for you.  Also your liver tests were elevated today.  This may be due to excessive Tylenol use recently.  I recommend that you avoid Tylenol for 1 to 2 weeks.  You may take ibuprofen if needed for body aches and/or fever.  Drink plenty of fluids.  Return to the emergency department for any new or worsening symptoms.

## 2023-04-23 NOTE — ED Notes (Signed)
IV attempted and unsuccessful x2

## 2023-04-23 NOTE — ED Triage Notes (Signed)
Pt reports URI since Thursday with negative flu, covid, RSV and strep at PCP office on Friday.  Pt reports she was seen at [facility] on Saturday because she began having N/V.  Pt reports she was unable to give them a urine sample at that time so they told her to follow up with PCP for UA but he could not see her today and she is having dysuria with N/V and the zofran isn't helping.

## 2023-04-23 NOTE — ED Provider Notes (Signed)
[facility] EMERGENCY DEPARTMENT AT [facility] Provider Note   CSN: [id number] Arrival date & time: 04/23/23  1058     History  Chief Complaint  Patient presents with   Dysuria    Alexis Ward is a 27 y.o. female.   Dysuria Associated symptoms: fever, nausea and vomiting        Alexis Ward is a 27 y.o. female with past medical history of kidney stone who presents to the Emergency Department complaining of diffuse headache, generalized bodyaches, fever and difficulty urinating.  She also complains of URI symptoms since late last week.  Was seen by PCP had negative COVID flu and RSV testing and strep test was negative as well.  Endorses fever and nausea vomiting over the weekend, was seen at [facility] and treated with IV fluids.  She was unable to urinate at that time was discharged with instructions to follow-up with PCP to obtain urinalysis.  Feels like she is unable to completely empty her bladder, but feels pressure to urinate.  States she came here today because she continues to have flank pain, vomiting and PCP was unable to see her.  She was prescribed Zofran on her previous visit and states medication is not helping with her vomiting.  Denies any diarrhea, chest pain, shortness of breath.  No rash or recent tick bite, no neck pain or stiffness  Home Medications Prior to Admission medications   Medication Sig Start Date End Date Taking? Authorizing Provider  norgestimate-ethinyl estradiol (ORTHO-CYCLEN) 0.25-35 MG-MCG tablet Take 1 tablet by mouth daily. 06/13/22  Yes Copland, Helmut Muster B, PA-C  ondansetron (ZOFRAN-ODT) 4 MG disintegrating tablet Take 4 mg by mouth every 8 (eight) hours as needed for nausea or vomiting.   Yes [provider]  triamcinolone ointment (KENALOG) 0.1 % Apply topically. 02/13/23 02/13/24 Yes [provider]      Allergies    Patient has no known allergies.    Review of Systems   Review of Systems   Constitutional:  Positive for appetite change and fever. Negative for chills.  HENT:  Positive for congestion. Negative for sore throat.   Respiratory:  Positive for cough. Negative for shortness of breath.   Cardiovascular:  Negative for chest pain.  Gastrointestinal:  Positive for nausea and vomiting. Negative for constipation and diarrhea.  Genitourinary:  Positive for dysuria.  Musculoskeletal:  Negative for arthralgias, neck pain and neck stiffness.  Skin:  Negative for wound.  Neurological:  Negative for dizziness, weakness, numbness and headaches.    Physical Exam Updated Vital Signs BP 119/87 (BP Location: Right Arm)   Pulse 68   Temp 100 F (37.8 C) (Oral)   Resp 16   Ht 5\' 2"  (1.575 m)   Wt 74.4 kg   LMP 04/10/2023   SpO2 100%   BMI 30.00 kg/m  Physical Exam Vitals and nursing note reviewed.  Constitutional:      General: She is not in acute distress.    Appearance: Normal appearance. She is not ill-appearing or toxic-appearing.  HENT:     Mouth/Throat:     Mouth: Mucous membranes are moist.     Pharynx: No posterior oropharyngeal erythema.  Neck:     Trachea: Phonation normal.     Meningeal: Kernig's sign absent.  Cardiovascular:     Rate and Rhythm: Normal rate and regular rhythm.     Pulses: Normal pulses.  Pulmonary:     Effort: Pulmonary effort is normal. No respiratory distress.  Breath sounds: Normal breath sounds.  Abdominal:     General: There is no distension.     Palpations: Abdomen is soft.     Tenderness: There is no abdominal tenderness. There is no right CVA tenderness or left CVA tenderness.  Musculoskeletal:        General: Normal range of motion.     Cervical back: Normal range of motion. No rigidity.  Skin:    General: Skin is warm.     Capillary Refill: Capillary refill takes less than 2 seconds.  Neurological:     General: No focal deficit present.     Mental Status: She is alert.     Sensory: No sensory deficit.     Motor:  No weakness.     ED Results / Procedures / Treatments   Labs (all labs ordered are listed, but only abnormal results are displayed) Labs Reviewed  URINALYSIS, ROUTINE W REFLEX MICROSCOPIC - Abnormal; Notable for the following components:      Result Value   Color, Urine AMBER (*)    APPearance HAZY (*)    Specific Gravity, Urine 1.038 (*)    Protein, ur 100 (*)    Leukocytes,Ua TRACE (*)    Bacteria, UA RARE (*)    All other components within normal limits  CBC WITH DIFFERENTIAL/PLATELET - Abnormal; Notable for the following components:   WBC 3.0 (*)    Platelets 98 (*)    All other components within normal limits  COMPREHENSIVE METABOLIC PANEL - Abnormal; Notable for the following components:   Sodium 133 (*)    Calcium 8.1 (*)    Total Protein 6.4 (*)    Albumin 3.3 (*)    AST 302 (*)    ALT 395 (*)    Anion gap 3 (*)    All other components within normal limits  PREGNANCY, URINE  LIPASE, BLOOD  MAGNESIUM    EKG None  Radiology CT ABDOMEN PELVIS WO CONTRAST  Result Date: 04/23/2023 CLINICAL DATA:  LLQ abdominal pain left LLQ and flank pain. no hx of kidney stone EXAM: CT ABDOMEN AND PELVIS WITHOUT CONTRAST TECHNIQUE: Multidetector CT imaging of the abdomen and pelvis was performed following the standard protocol without IV contrast. RADIATION DOSE REDUCTION: This exam was performed according to the departmental dose-optimization program which includes automated exposure control, adjustment of the mA and/or kV according to patient size and/or use of iterative reconstruction technique. COMPARISON:  CT scan from 10/03/2022. FINDINGS: Lower chest: There are multiple, subcentimeter sized noncalcified nodules in the visualized bilateral lungs which exhibit mild surrounding ground-glass changes. The largest nodule is in the right lower lobe and measures up to 6 x 6 mm. These are nonspecific but concerning for sequela of infection/inflammation. These are new since the prior study  from January 2024. Short-term follow-up examination in 6-12 weeks is recommended. Bilateral lungs are otherwise clear. No pleural effusion. The heart is normal in size. No pericardial effusion. Hepatobiliary: The liver is normal in size. Non-cirrhotic configuration. No suspicious mass. No intrahepatic or extrahepatic bile duct dilation. No calcified gallstones. Normal gallbladder wall thickness. No pericholecystic inflammatory changes. Pancreas: Unremarkable. No pancreatic ductal dilatation or surrounding inflammatory changes. Spleen: Normal in size. There are multiple subcentimeter calcified granulomas in the spleen. No other focal lesion. Adrenals/Urinary Tract: Adrenal glands are unremarkable. No suspicious renal mass. No hydronephrosis. There are 2-3 punctate nonobstructing calculi in bilateral kidneys. No ureterolithiasis. Urinary bladder is under distended, precluding optimal assessment. However, no large mass or stones identified.  No perivesical fat stranding. Stomach/Bowel: No disproportionate dilation of the small or large bowel loops. No evidence of abnormal bowel wall thickening or inflammatory changes. The appendix is unremarkable. Vascular/Lymphatic: No ascites or pneumoperitoneum. No abdominal or pelvic lymphadenopathy, by size criteria. No aneurysmal dilation of the major abdominal arteries. Reproductive: The uterus is unremarkable. The ovaries are unremarkable. Other: There is a tiny fat containing umbilical hernia. The soft tissues and abdominal wall are otherwise unremarkable. Musculoskeletal: No suspicious osseous lesions. There are mild multilevel degenerative changes in the visualized spine. IMPRESSION: 1. No acute inflammatory process within the abdomen or pelvis. 2. Punctate nonobstructing calculi in bilateral kidneys. No ureterolithiasis or obstructive uropathy. 3. Multiple subcentimeter sized noncalcified nodules in the visualized bilateral lungs with surrounding ground-glass changes. These  are nonspecific but concerning for sequela of infection/inflammation. Short-term follow-up examination in 6-12 weeks is recommended. Electronically Signed   By: Jules Schick M.D.   On: 04/23/2023 15:35    Procedures Procedures    Medications Ordered in ED Medications  oxyCODONE (Oxy IR/ROXICODONE) immediate release tablet 5 mg (has no administration in time range)  sodium chloride 0.9 % bolus 1,000 mL (0 mLs Intravenous Stopped 04/23/23 1436)  ketorolac (TORADOL) 30 MG/ML injection 30 mg (30 mg Intravenous Given 04/23/23 1513)    ED Course/ Medical Decision Making/ A&P                             Medical Decision Making Patient here with symptoms suggestive of viral process.  Recently had negative respiratory panel also endorses having fever and difficulty urinating.  Was seen at another ER facility without urine being obtained.  Patient was unable to follow-up with PCP as recommended here due to persistent symptoms.  Differential would include but not limited to pneumonia, viral process, pyelonephritis, kidney stone, UTI, infected kidney stone  Amount and/or Complexity of Data Reviewed Labs: ordered.    Details: Labs interpreted by me, patient with leukopenia, white count of 3, hemoglobin unremarkable, mild thrombocytopenia as well.  Lipase unremarkable, AST and ALT slightly elevated magnesium unremarkable, urinalysis shows hazy urine with some proteinuria, trace leukocytes 6-10 white cells and rare bacteria, pregnancy test negative Radiology: ordered.    Details: CT abdomen pelvis ordered for further evaluation.  Shows no acute process within the abdomen or pelvis.  Multiple subcentimeter sized nodules in the bilateral lungs with surrounding groundglass change Discussion of management or test interpretation with external provider(s): On recheck, patient continues to complain of headache, but left flank pain has improved.  No vomiting during ER stay.  Will try oral fluid challenge.  Pre  void bladder scan showed  PVR of 6 mL  patient ambulated to the restroom without difficulty.  Able to void here on her own..  Urine culture pending  Patient's LFTs elevated here, felt to be related to her increased Tylenol use recently.  I have recommended that she avoid Tylenol for 1 week.  She will follow-up with PCP to have recheck of chest x-ray and LFTs.  Recommended ibuprofen if needed for fever body aches.  Will treat with antibiotics and return precautions were discussed    Risk Prescription drug management.           Final Clinical Impression(s) / ED Diagnoses Final diagnoses:  Acute cystitis without hematuria  Lung nodules  Elevated liver enzymes    Rx / DC Orders ED Discharge Orders     None  Pauline Aus, PA-C 04/25/23 1355    Terrilee Files, MD 04/26/23 [phone]

## 2023-05-04 DIAGNOSIS — R9389 Abnormal findings on diagnostic imaging of other specified body structures: Secondary | ICD-10-CM | POA: Diagnosis not present

## 2023-05-04 DIAGNOSIS — R748 Abnormal levels of other serum enzymes: Secondary | ICD-10-CM | POA: Diagnosis not present

## 2023-05-22 DIAGNOSIS — Z0000 Encounter for general adult medical examination without abnormal findings: Secondary | ICD-10-CM | POA: Diagnosis not present

## 2023-05-22 DIAGNOSIS — F419 Anxiety disorder, unspecified: Secondary | ICD-10-CM | POA: Diagnosis not present

## 2023-06-04 DIAGNOSIS — D649 Anemia, unspecified: Secondary | ICD-10-CM | POA: Diagnosis not present

## 2023-06-04 DIAGNOSIS — R7989 Other specified abnormal findings of blood chemistry: Secondary | ICD-10-CM | POA: Diagnosis not present

## 2023-06-04 DIAGNOSIS — R9389 Abnormal findings on diagnostic imaging of other specified body structures: Secondary | ICD-10-CM | POA: Diagnosis not present

## 2023-06-04 DIAGNOSIS — R918 Other nonspecific abnormal finding of lung field: Secondary | ICD-10-CM | POA: Diagnosis not present

## 2023-06-04 DIAGNOSIS — J984 Other disorders of lung: Secondary | ICD-10-CM | POA: Diagnosis not present

## 2023-07-25 IMAGING — CT CT ANGIO CHEST
2 of 6 series · 19 of 46 positions shown · IV contrast (Omnipaque or Isovue)
Comparison: None.

CLINICAL DATA: Concern for pulmonary embolism.

EXAM:
CT ANGIOGRAPHY CHEST WITH CONTRAST
TECHNIQUE: Multidetector CT imaging of the chest was performed using the
standard protocol during bolus administration of intravenous
contrast. Multiplanar CT image reconstructions and MIPs were
obtained to evaluate the vascular anatomy.
CONTRAST:  75mL OMNIPAQUE IOHEXOL 350 MG/ML SOLN

[Series 6: pe axial thins · axial · 0.68mm/px · z∈[+1158,+1412]mm · 16 of 348 slices shown]
[im 15/348  lung]
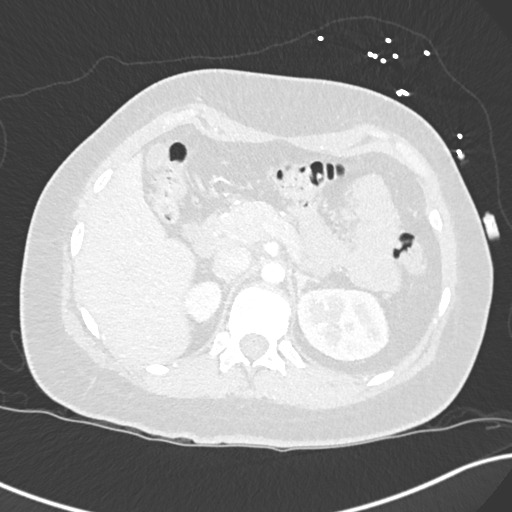
[im 44/348  soft-tissue]
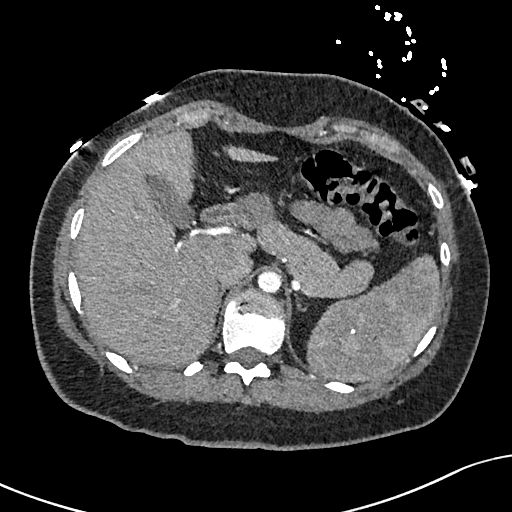
[im 58/348  lung]
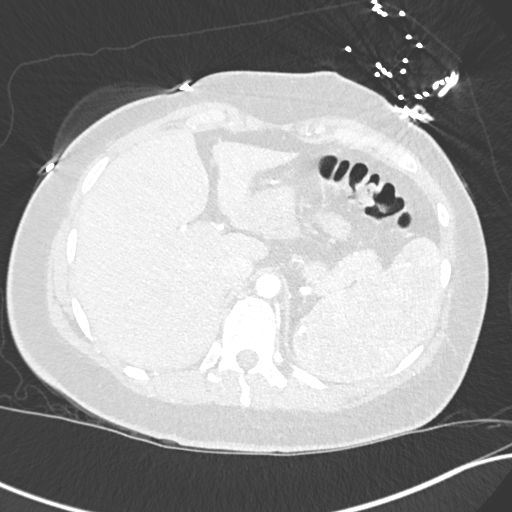
[im 87/348  soft-tissue]
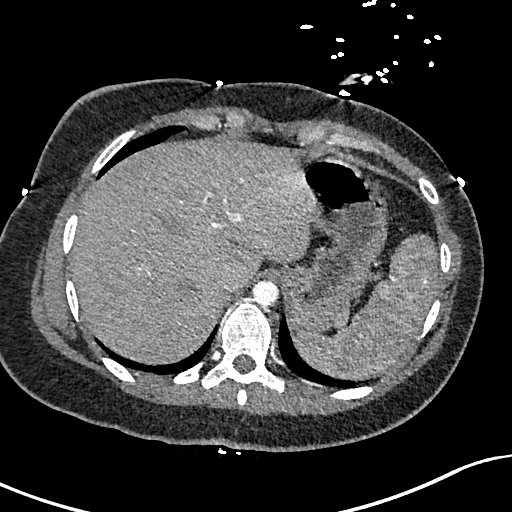
[im 102/348  lung]
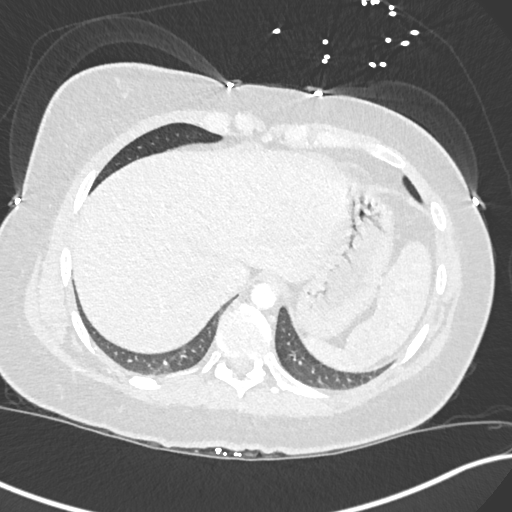
[im 116/348  soft-tissue]
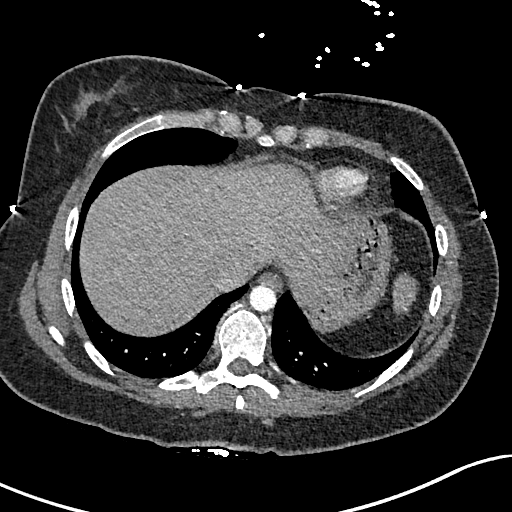
[im 145/348  lung]
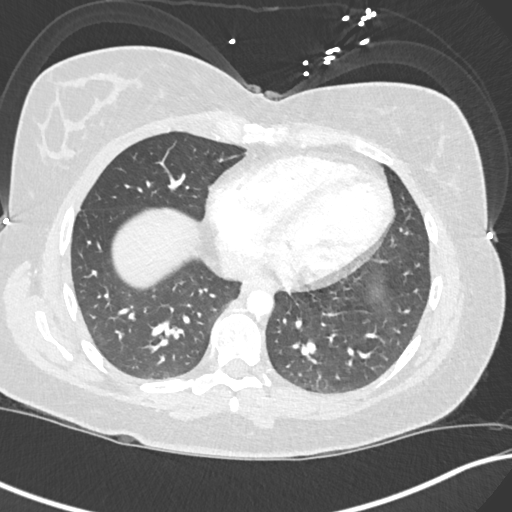
[im 160/348  soft-tissue]
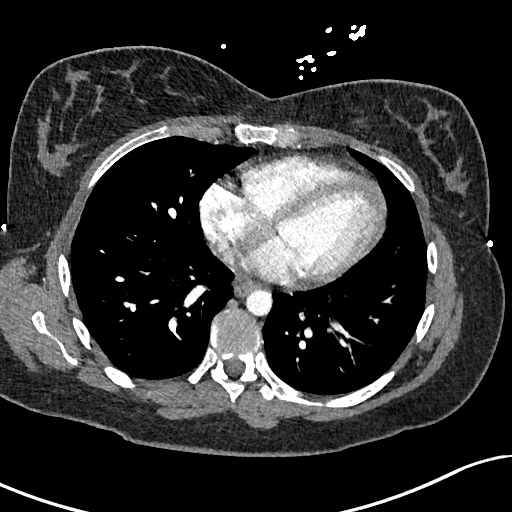
[im 188/348  lung]
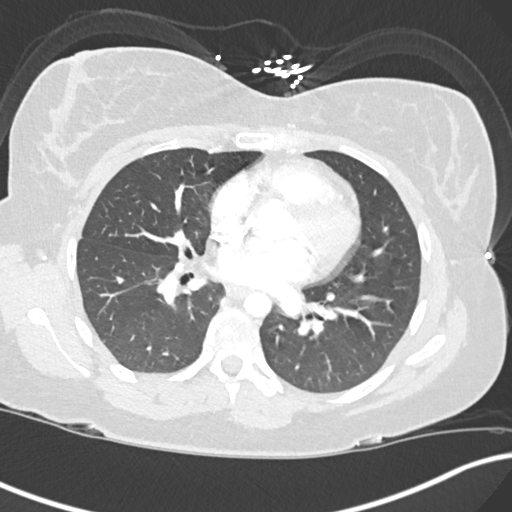
[im 203/348  soft-tissue]
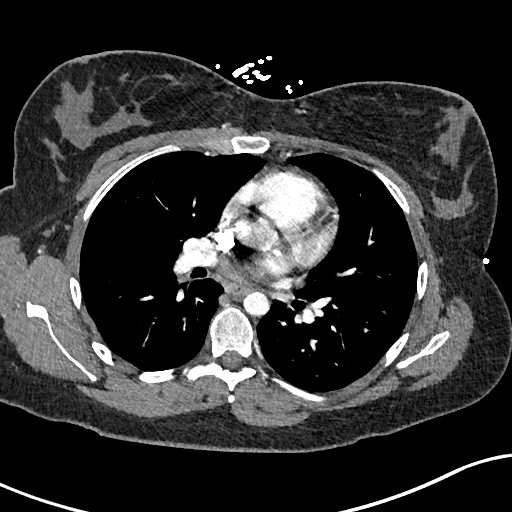
[im 232/348  lung]
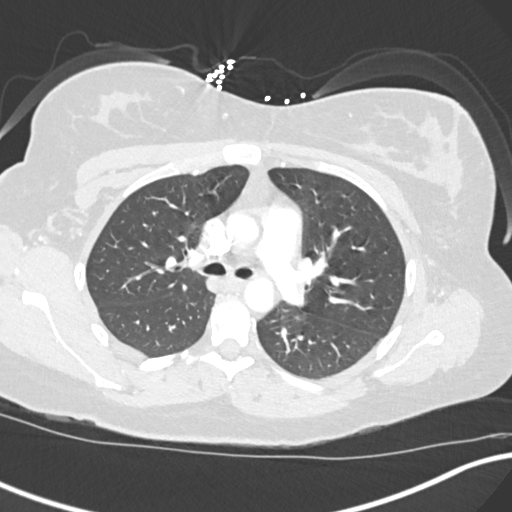
[im 246/348  soft-tissue]
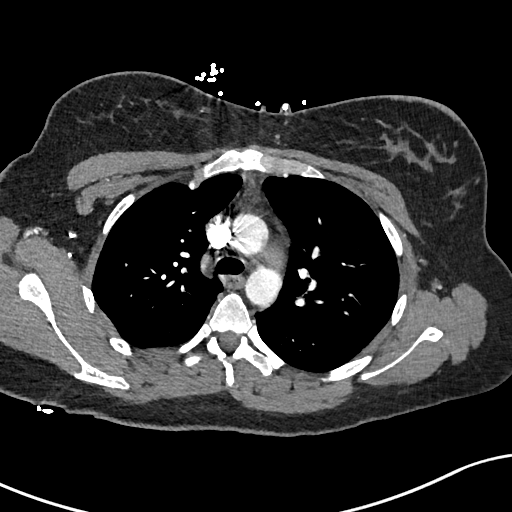
[im 261/348  lung]
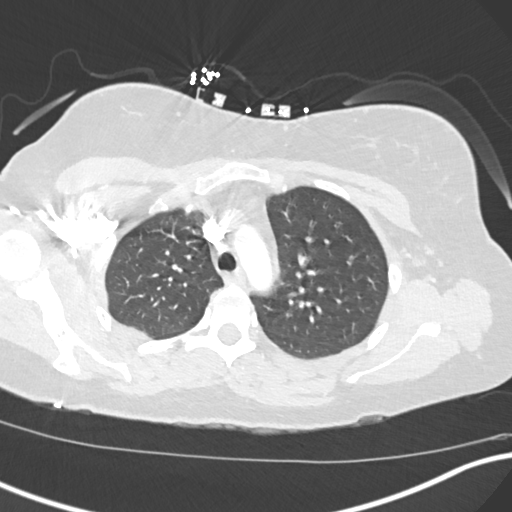
[im 290/348  soft-tissue]
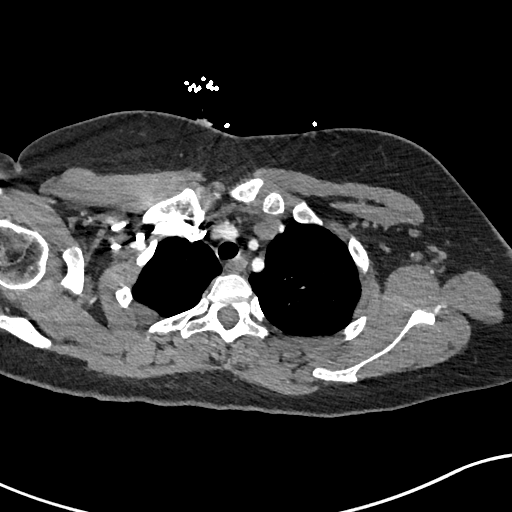
[im 304/348  lung]
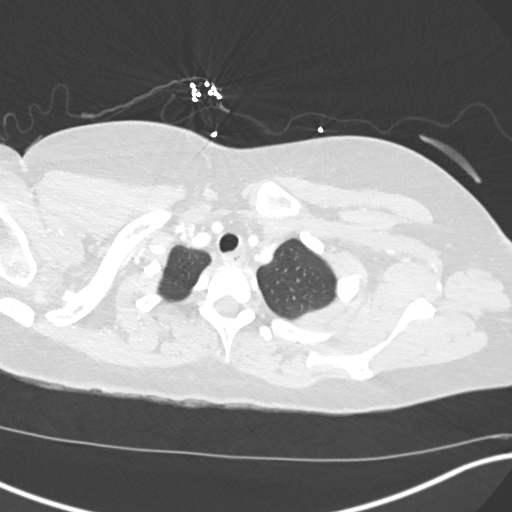
[im 333/348  soft-tissue]
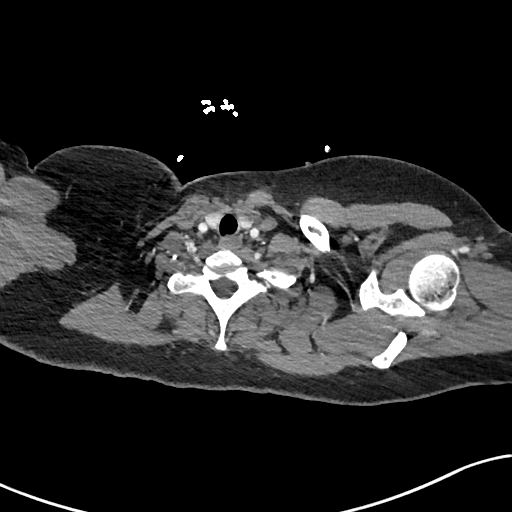

[Series 8: cor soft · coronal · 0.56mm/px · 3 of 147 slices shown]
[im 37/147  soft-tissue]
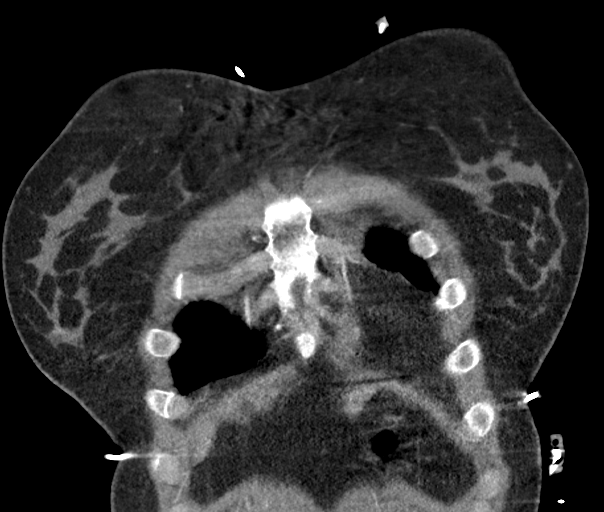
[im 74/147  soft-tissue]
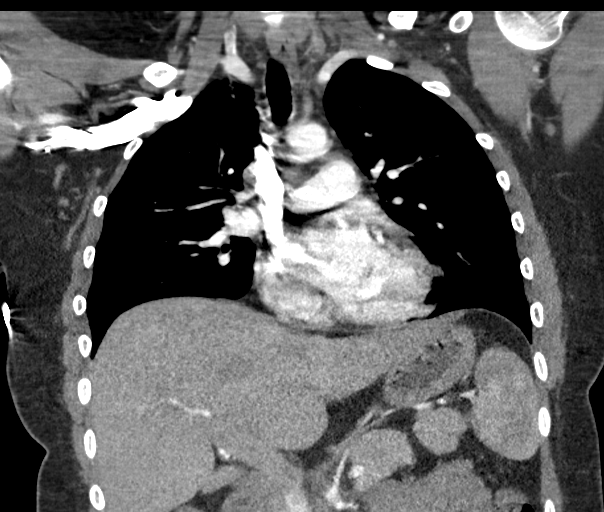
[im 110/147  soft-tissue]
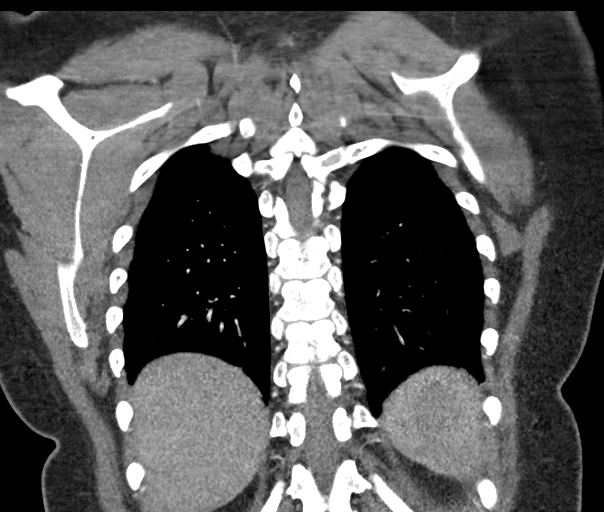

[19 of 46 positions shown; findings below may reference images not displayed]

FINDINGS: Evaluation of this exam is limited due to respiratory motion
artifact.

Cardiovascular: There is no cardiomegaly or pericardial effusion.
The thoracic aorta is unremarkable. The origins of the great vessels
of the aortic arch appear patent. Evaluation of the pulmonary
arteries is limited due to respiratory motion artifact. No pulmonary
artery embolus identified.

Mediastinum/Nodes: There is no hilar or mediastinal adenopathy. The
esophagus is grossly unremarkable. No mediastinal fluid collection.

Lungs/Pleura: The lungs are clear. There is no pleural effusion or
pneumothorax. The central airways are patent.

Upper Abdomen: Small scattered calcified splenic granuloma.

Musculoskeletal: No chest wall abnormality. No acute or significant
osseous findings.

Review of the MIP images confirms the above findings.
IMPRESSION: No acute intrathoracic pathology. No CT evidence of pulmonary
embolism.

## 2023-08-07 ENCOUNTER — Other Ambulatory Visit: Payer: Self-pay

## 2023-08-07 DIAGNOSIS — Z3041 Encounter for surveillance of contraceptive pills: Secondary | ICD-10-CM

## 2023-08-07 MED ORDER — NORGESTIMATE-ETH ESTRADIOL 0.25-35 MG-MCG PO TABS: 1.0000 | ORAL_TABLET | Freq: Every day | ORAL | 0 refills | Status: DC

## 2023-08-07 NOTE — Telephone Encounter (Signed)
Pt calling for refill of her bc to get her to her appt c [facility] in Dec.  Order sent while on the phone with the pt.

## 2023-09-04 NOTE — Progress Notes (Unsigned)
PCP:  Beatrix Fetters, MD   No chief complaint on file.    HPI:      [name] is a 27 y.o. [phone] whose LMP was No LMP recorded., presents today for her annual examination.  Her menses are regular every 28-30 days, lasting 5-7 days.  Dysmenorrhea mild, improved with NSAIDs. She does not have intermenstrual bleeding. Menses late this month with neg UPTs, most recently 2 days ago. Did have late pill this cycle.  Sex activity: single partner, contraception - OCP (estrogen/progesterone). No pain/bleeding. Last Pap: 11/26/20  Results were: no abnormalities   There is a FH of breast cancer in her pat aunt and PGM. Pt is MyRisk neg, IBIS=19%/riskscore=12%. There is no FH of ovarian cancer. The patient does do self-breast exams.  Tobacco use: The patient denies current or previous tobacco use. Alcohol use: none No drug use.  Exercise: moderately active  She does get adequate calcium but not Vitamin D in her diet.  Past Medical History:  Diagnosis Date   BRCA negative 11/2020   MyRisk neg   Family history of breast cancer 11/2020   IBIS=19%/riskscore=12%   Kidney stone    Renal disorder      Past Surgical History:  Procedure Laterality Date   NO PAST SURGERIES      Family History  Problem Relation Age of Onset   Breast cancer Paternal Grandmother        30s   Breast cancer Paternal Aunt        30s    Social History   Socioeconomic History   Marital status: Married    Spouse name: Not on file   Number of children: Not on file   Years of education: Not on file   Highest education level: Not on file  Occupational History   Not on file  Tobacco Use   Smoking status: Never   Smokeless tobacco: Never  Vaping Use   Vaping status: Never Used  Substance and Sexual Activity   Alcohol use: No   Drug use: No   Sexual activity: Yes    Birth control/protection: Pill  Other Topics Concern   Not on file  Social History Narrative   Not on file   Social  Determinants of Health   Financial Resource Strain: Low Risk  (02/13/2023)   Received from [facility]   Overall Financial Resource Strain (CARDIA)    Difficulty of Paying Living Expenses: Not very hard  Food Insecurity: No Food Insecurity (02/13/2023)   Received from [facility]   Hunger Vital Sign    Worried About Running Out of Food in the Last Year: Never true    Ran Out of Food in the Last Year: Never true  Transportation Needs: No Transportation Needs (02/13/2023)   Received from [facility]   PRAPARE - Transportation    Lack of Transportation (Medical): No    Lack of Transportation (Non-Medical): No  Physical Activity: Not on file  Stress: Not on file  Social Connections: Not on file  Intimate Partner Violence: Not At Risk (02/13/2023)   Received from [facility]   Humiliation, Afraid, Rape, and Kick questionnaire    Fear of Current or Ex-Partner: No    Emotionally Abused: No    Physically Abused: No    Sexually Abused: No     Current Outpatient Medications:    amoxicillin-clavulanate (AUGMENTIN) 875-125 MG tablet, Take 1 tablet by mouth every 12 (twelve) hours., Disp:  14 tablet, Rfl: 0   norgestimate-ethinyl estradiol (ORTHO-CYCLEN) 0.25-35 MG-MCG tablet, Take 1 tablet by mouth daily., Disp: 84 tablet, Rfl: 0   ondansetron (ZOFRAN-ODT) 4 MG disintegrating tablet, Take 4 mg by mouth every 8 (eight) hours as needed for nausea or vomiting., Disp: , Rfl:    oxyCODONE (ROXICODONE) 5 MG immediate release tablet, Take 1 tablet (5 mg total) by mouth every 6 (six) hours as needed for severe pain., Disp: 8 tablet, Rfl: 0   triamcinolone ointment (KENALOG) 0.1 %, Apply topically., Disp: , Rfl:      ROS:  Review of Systems  Constitutional:  Negative for fatigue, fever and unexpected weight change.  Respiratory:  Negative for cough, shortness of breath and wheezing.   Cardiovascular:  Negative for chest pain, palpitations and leg swelling.  Gastrointestinal:   Negative for blood in stool, constipation, diarrhea, nausea and vomiting.  Endocrine: Negative for cold intolerance, heat intolerance and polyuria.  Genitourinary:  Negative for dyspareunia, dysuria, flank pain, frequency, genital sores, hematuria, menstrual problem, pelvic pain, urgency, vaginal bleeding, vaginal discharge and vaginal pain.  Musculoskeletal:  Negative for back pain, joint swelling and myalgias.  Skin:  Negative for rash.  Neurological:  Negative for dizziness, syncope, light-headedness, numbness and headaches.  Hematological:  Negative for adenopathy.  Psychiatric/Behavioral:  Negative for agitation, confusion, sleep disturbance and suicidal ideas. The patient is not nervous/anxious.    BREAST: No symptoms   Objective: There were no vitals taken for this visit.   Physical Exam Constitutional:      Appearance: She is well-developed.  Genitourinary:     Vulva normal.     Right Labia: No rash, tenderness or lesions.    Left Labia: No tenderness, lesions or rash.    No vaginal discharge, erythema or tenderness.      Right Adnexa: not tender and no mass present.    Left Adnexa: not tender and no mass present.    No cervical friability or polyp.     Uterus is not enlarged or tender.  Breasts:    Right: No mass, nipple discharge, skin change or tenderness.     Left: No mass, nipple discharge, skin change or tenderness.  Neck:     Thyroid: No thyromegaly.  Cardiovascular:     Rate and Rhythm: Normal rate and regular rhythm.     Heart sounds: Normal heart sounds. No murmur heard. Pulmonary:     Effort: Pulmonary effort is normal.     Breath sounds: Normal breath sounds.  Abdominal:     Palpations: Abdomen is soft.     Tenderness: There is no abdominal tenderness. There is no guarding or rebound.  Musculoskeletal:        General: Normal range of motion.     Cervical back: Normal range of motion.  Lymphadenopathy:     Cervical: No cervical adenopathy.   Neurological:     General: No focal deficit present.     Mental Status: She is alert and oriented to person, place, and time.     Cranial Nerves: No cranial nerve deficit.  Skin:    General: Skin is warm and dry.  Psychiatric:        Mood and Affect: Mood normal.        Behavior: Behavior normal.        Thought Content: Thought content normal.        Judgment: Judgment normal.  Vitals reviewed.    Assessment/Plan: Encounter for annual routine gynecological examination  Encounter for  surveillance of contraceptive pills - Plan: norgestimate-ethinyl estradiol (ORTHO-CYCLEN) 0.25-35 MG-MCG tablet; OCP RF  Family history of breast cancer--pt is MyRisk neg, no addl screening at this time  No orders of the defined types were placed in this encounter.            GYN counsel adequate intake of calcium and vitamin D, diet and exercise     F/U  No follow-ups on file.  Aviance Cooperwood B. Seniah Lawrence, PA-C 09/04/2023 3:30 PM

## 2023-09-06 ENCOUNTER — Ambulatory Visit: Payer: BC Managed Care – PPO | Admitting: Obstetrics and Gynecology

## 2023-09-06 DIAGNOSIS — Z3041 Encounter for surveillance of contraceptive pills: Secondary | ICD-10-CM

## 2023-09-06 DIAGNOSIS — Z01419 Encounter for gynecological examination (general) (routine) without abnormal findings: Secondary | ICD-10-CM

## 2023-09-10 ENCOUNTER — Encounter: Payer: Self-pay | Admitting: Obstetrics and Gynecology

## 2023-10-12 ENCOUNTER — Other Ambulatory Visit: Payer: Self-pay | Admitting: Obstetrics and Gynecology

## 2023-10-12 DIAGNOSIS — Z3041 Encounter for surveillance of contraceptive pills: Secondary | ICD-10-CM

## 2023-10-15 MED ORDER — NORGESTIMATE-ETH ESTRADIOL 0.25-35 MG-MCG PO TABS: 1.0000 | ORAL_TABLET | Freq: Every day | ORAL | 0 refills | Status: DC

## 2023-10-29 NOTE — Progress Notes (Deleted)
 PCP:  Kotturi, Vinay K, MD   No chief complaint on file.    HPI:      Alexis Ward is a 28 y.o. [phone] whose LMP was No LMP recorded., presents today for her annual examination.  Her menses are regular every 28-30 days, lasting 5-7 days.  Dysmenorrhea mild, improved with NSAIDs. She does not have intermenstrual bleeding. Menses late this month with neg UPTs, most recently 2 days ago. Did have late pill this cycle.  Sex activity: single partner, contraception - OCP (estrogen/progesterone). No pain/bleeding. Last Pap: 11/26/20  Results were: no abnormalities   There is a FH of breast cancer in her pat aunt and PGM. Pt is MyRisk neg, IBIS=19%/riskscore=12%. There is no FH of ovarian cancer. The patient does do self-breast exams.  Tobacco use: The patient denies current or previous tobacco use. Alcohol use: none No drug use.  Exercise: moderately active  She does get adequate calcium but not Vitamin D in her diet.  Past Medical History:  Diagnosis Date   BRCA negative 11/2020   MyRisk neg   Family history of breast cancer 11/2020   IBIS=19%/riskscore=12%   Kidney stone    Renal disorder      Past Surgical History:  Procedure Laterality Date   NO PAST SURGERIES      Family History  Problem Relation Age of Onset   Breast cancer Paternal Grandmother        30s   Breast cancer Paternal Aunt        30s    Social History   Socioeconomic History   Marital status: Married    Spouse name: Not on file   Number of children: Not on file   Years of education: Not on file   Highest education level: Not on file  Occupational History   Not on file  Tobacco Use   Smoking status: Never   Smokeless tobacco: Never  Vaping Use   Vaping status: Never Used  Substance and Sexual Activity   Alcohol use: No   Drug use: No   Sexual activity: Yes    Birth control/protection: Pill  Other Topics Concern   Not on file  Social History Narrative   Not on file   Social  Drivers of Health   Financial Resource Strain: Low Risk  (02/13/2023)   Received from [facility]   Overall Financial Resource Strain (CARDIA)    Difficulty of Paying Living Expenses: Not very hard  Food Insecurity: No Food Insecurity (02/13/2023)   Received from [facility]   Hunger Vital Sign    Worried About Running Out of Food in the Last Year: Never true    Ran Out of Food in the Last Year: Never true  Transportation Needs: No Transportation Needs (02/13/2023)   Received from [facility]   PRAPARE - Transportation    Lack of Transportation (Medical): No    Lack of Transportation (Non-Medical): No  Physical Activity: Not on file  Stress: Not on file  Social Connections: Not on file  Intimate Partner Violence: Not At Risk (02/13/2023)   Received from [facility]   Humiliation, Afraid, Rape, and Kick questionnaire    Fear of Current or Ex-Partner: No    Emotionally Abused: No    Physically Abused: No    Sexually Abused: No     Current Outpatient Medications:    amoxicillin -clavulanate (AUGMENTIN ) 875-125 MG tablet, Take 1 tablet by mouth every 12 (twelve) hours., Disp:  14 tablet, Rfl: 0   norgestimate -ethinyl estradiol  (ORTHO-CYCLEN) 0.25-35 MG-MCG tablet, Take 1 tablet by mouth daily., Disp: 84 tablet, Rfl: 0   ondansetron  (ZOFRAN -ODT) 4 MG disintegrating tablet, Take 4 mg by mouth every 8 (eight) hours as needed for nausea or vomiting., Disp: , Rfl:    oxyCODONE  (ROXICODONE ) 5 MG immediate release tablet, Take 1 tablet (5 mg total) by mouth every 6 (six) hours as needed for severe pain., Disp: 8 tablet, Rfl: 0   triamcinolone ointment (KENALOG) 0.1 %, Apply topically., Disp: , Rfl:      ROS:  Review of Systems  Constitutional:  Negative for fatigue, fever and unexpected weight change.  Respiratory:  Negative for cough, shortness of breath and wheezing.   Cardiovascular:  Negative for chest pain, palpitations and leg swelling.  Gastrointestinal:  Negative  for blood in stool, constipation, diarrhea, nausea and vomiting.  Endocrine: Negative for cold intolerance, heat intolerance and polyuria.  Genitourinary:  Negative for dyspareunia, dysuria, flank pain, frequency, genital sores, hematuria, menstrual problem, pelvic pain, urgency, vaginal bleeding, vaginal discharge and vaginal pain.  Musculoskeletal:  Negative for back pain, joint swelling and myalgias.  Skin:  Negative for rash.  Neurological:  Negative for dizziness, syncope, light-headedness, numbness and headaches.  Hematological:  Negative for adenopathy.  Psychiatric/Behavioral:  Negative for agitation, confusion, sleep disturbance and suicidal ideas. The patient is not nervous/anxious.    BREAST: No symptoms   Objective: There were no vitals taken for this visit.   Physical Exam Constitutional:      Appearance: She is well-developed.  Genitourinary:     Vulva normal.     Right Labia: No rash, tenderness or lesions.    Left Labia: No tenderness, lesions or rash.    No vaginal discharge, erythema or tenderness.      Right Adnexa: not tender and no mass present.    Left Adnexa: not tender and no mass present.    No cervical friability or polyp.     Uterus is not enlarged or tender.  Breasts:    Right: No mass, nipple discharge, skin change or tenderness.     Left: No mass, nipple discharge, skin change or tenderness.  Neck:     Thyroid : No thyromegaly.  Cardiovascular:     Rate and Rhythm: Normal rate and regular rhythm.     Heart sounds: Normal heart sounds. No murmur heard. Pulmonary:     Effort: Pulmonary effort is normal.     Breath sounds: Normal breath sounds.  Abdominal:     Palpations: Abdomen is soft.     Tenderness: There is no abdominal tenderness. There is no guarding or rebound.  Musculoskeletal:        General: Normal range of motion.     Cervical back: Normal range of motion.  Lymphadenopathy:     Cervical: No cervical adenopathy.  Neurological:      General: No focal deficit present.     Mental Status: She is alert and oriented to person, place, and time.     Cranial Nerves: No cranial nerve deficit.  Skin:    General: Skin is warm and dry.  Psychiatric:        Mood and Affect: Mood normal.        Behavior: Behavior normal.        Thought Content: Thought content normal.        Judgment: Judgment normal.  Vitals reviewed.    Assessment/Plan: Encounter for annual routine gynecological examination  Encounter for  surveillance of contraceptive pills - Plan: norgestimate -ethinyl estradiol  (ORTHO-CYCLEN) 0.25-35 MG-MCG tablet; OCP RF  Family history of breast cancer--pt is MyRisk neg, no addl screening at this time  No orders of the defined types were placed in this encounter.            GYN counsel adequate intake of calcium and vitamin D, diet and exercise     F/U  No follow-ups on file.  Chee Kinslow B. Andora Krull, PA-C 10/29/2023 8:40 PM

## 2023-10-30 ENCOUNTER — Ambulatory Visit: Payer: BC Managed Care – PPO | Admitting: Obstetrics and Gynecology

## 2023-10-30 DIAGNOSIS — Z124 Encounter for screening for malignant neoplasm of cervix: Secondary | ICD-10-CM

## 2023-10-30 DIAGNOSIS — Z01419 Encounter for gynecological examination (general) (routine) without abnormal findings: Secondary | ICD-10-CM

## 2023-10-30 DIAGNOSIS — Z803 Family history of malignant neoplasm of breast: Secondary | ICD-10-CM

## 2023-10-30 DIAGNOSIS — Z3041 Encounter for surveillance of contraceptive pills: Secondary | ICD-10-CM

## 2023-11-13 ENCOUNTER — Ambulatory Visit: Payer: Self-pay | Admitting: Obstetrics and Gynecology

## 2023-12-14 ENCOUNTER — Telehealth: Payer: Self-pay | Admitting: Obstetrics and Gynecology

## 2023-12-14 NOTE — Telephone Encounter (Signed)
 Reached out to pt to reschedule annual exam that was scheduled on 11/13/2023 with [facility] bc [facility] was not in office.  Left message for pt to call back to schedule.  She has the following availability-April 10, April 14, and April15.  These are her first available appointments.

## 2024-01-16 NOTE — Progress Notes (Unsigned)
 PCP:  Kotturi, Vinay K, MD   No chief complaint on file.    HPI:      [name] is a 28 y.o. [phone] whose LMP was No LMP recorded., presents today for her annual examination.  Her menses are regular every 28-30 days, lasting 5-7 days.  Dysmenorrhea mild, improved with NSAIDs. She does not have intermenstrual bleeding. Menses late this month with neg UPTs, most recently 2 days ago. Did have late pill this cycle.  Sex activity: single partner, contraception - OCP (estrogen/progesterone). No pain/bleeding. Last Pap: 11/26/20  Results were: no abnormalities   There is a FH of breast cancer in her pat aunt and PGM. Pt is MyRisk neg, IBIS=19%/riskscore=12%. There is no FH of ovarian cancer. The patient does do self-breast exams.  Tobacco use: The patient denies current or previous tobacco use. Alcohol use: none No drug use.  Exercise: moderately active  She does get adequate calcium but not Vitamin D in her diet.  Past Medical History:  Diagnosis Date   BRCA negative 11/2020   MyRisk neg   Family history of breast cancer 11/2020   IBIS=19%/riskscore=12%   Kidney stone    Renal disorder      Past Surgical History:  Procedure Laterality Date   NO PAST SURGERIES      Family History  Problem Relation Age of Onset   Breast cancer Paternal Grandmother        30s   Breast cancer Paternal Aunt        30s    Social History   Socioeconomic History   Marital status: Married    Spouse name: Not on file   Number of children: Not on file   Years of education: Not on file   Highest education level: Not on file  Occupational History   Not on file  Tobacco Use   Smoking status: Never   Smokeless tobacco: Never  Vaping Use   Vaping status: Never Used  Substance and Sexual Activity   Alcohol use: No   Drug use: No   Sexual activity: Yes    Birth control/protection: Pill  Other Topics Concern   Not on file  Social History Narrative   Not on file   Social  Drivers of Health   Financial Resource Strain: Low Risk  (02/13/2023)   Received from [facility]   Overall Financial Resource Strain (CARDIA)    Difficulty of Paying Living Expenses: Not very hard  Food Insecurity: No Food Insecurity (02/13/2023)   Received from [facility]   Hunger Vital Sign    Worried About Running Out of Food in the Last Year: Never true    Ran Out of Food in the Last Year: Never true  Transportation Needs: No Transportation Needs (02/13/2023)   Received from [facility]   PRAPARE - Transportation    Lack of Transportation (Medical): No    Lack of Transportation (Non-Medical): No  Physical Activity: Not on file  Stress: Not on file  Social Connections: Not on file  Intimate Partner Violence: Not At Risk (02/13/2023)   Received from [facility]   Humiliation, Afraid, Rape, and Kick questionnaire    Fear of Current or Ex-Partner: No    Emotionally Abused: No    Physically Abused: No    Sexually Abused: No     Current Outpatient Medications:    amoxicillin -clavulanate (AUGMENTIN ) 875-125 MG tablet, Take 1 tablet by mouth every 12 (twelve) hours., Disp:  14 tablet, Rfl: 0   norgestimate -ethinyl estradiol  (ORTHO-CYCLEN) 0.25-35 MG-MCG tablet, Take 1 tablet by mouth daily., Disp: 84 tablet, Rfl: 0   ondansetron  (ZOFRAN -ODT) 4 MG disintegrating tablet, Take 4 mg by mouth every 8 (eight) hours as needed for nausea or vomiting., Disp: , Rfl:    oxyCODONE  (ROXICODONE ) 5 MG immediate release tablet, Take 1 tablet (5 mg total) by mouth every 6 (six) hours as needed for severe pain., Disp: 8 tablet, Rfl: 0   triamcinolone ointment (KENALOG) 0.1 %, Apply topically., Disp: , Rfl:      ROS:  Review of Systems  Constitutional:  Negative for fatigue, fever and unexpected weight change.  Respiratory:  Negative for cough, shortness of breath and wheezing.   Cardiovascular:  Negative for chest pain, palpitations and leg swelling.  Gastrointestinal:  Negative  for blood in stool, constipation, diarrhea, nausea and vomiting.  Endocrine: Negative for cold intolerance, heat intolerance and polyuria.  Genitourinary:  Negative for dyspareunia, dysuria, flank pain, frequency, genital sores, hematuria, menstrual problem, pelvic pain, urgency, vaginal bleeding, vaginal discharge and vaginal pain.  Musculoskeletal:  Negative for back pain, joint swelling and myalgias.  Skin:  Negative for rash.  Neurological:  Negative for dizziness, syncope, light-headedness, numbness and headaches.  Hematological:  Negative for adenopathy.  Psychiatric/Behavioral:  Negative for agitation, confusion, sleep disturbance and suicidal ideas. The patient is not nervous/anxious.    BREAST: No symptoms   Objective: There were no vitals taken for this visit.   Physical Exam Constitutional:      Appearance: She is well-developed.  Genitourinary:     Vulva normal.     Right Labia: No rash, tenderness or lesions.    Left Labia: No tenderness, lesions or rash.    No vaginal discharge, erythema or tenderness.      Right Adnexa: not tender and no mass present.    Left Adnexa: not tender and no mass present.    No cervical friability or polyp.     Uterus is not enlarged or tender.  Breasts:    Right: No mass, nipple discharge, skin change or tenderness.     Left: No mass, nipple discharge, skin change or tenderness.  Neck:     Thyroid : No thyromegaly.  Cardiovascular:     Rate and Rhythm: Normal rate and regular rhythm.     Heart sounds: Normal heart sounds. No murmur heard. Pulmonary:     Effort: Pulmonary effort is normal.     Breath sounds: Normal breath sounds.  Abdominal:     Palpations: Abdomen is soft.     Tenderness: There is no abdominal tenderness. There is no guarding or rebound.  Musculoskeletal:        General: Normal range of motion.     Cervical back: Normal range of motion.  Lymphadenopathy:     Cervical: No cervical adenopathy.  Neurological:      General: No focal deficit present.     Mental Status: She is alert and oriented to person, place, and time.     Cranial Nerves: No cranial nerve deficit.  Skin:    General: Skin is warm and dry.  Psychiatric:        Mood and Affect: Mood normal.        Behavior: Behavior normal.        Thought Content: Thought content normal.        Judgment: Judgment normal.  Vitals reviewed.    Assessment/Plan: Encounter for annual routine gynecological examination  Encounter for  surveillance of contraceptive pills - Plan: norgestimate -ethinyl estradiol  (ORTHO-CYCLEN) 0.25-35 MG-MCG tablet; OCP RF  Family history of breast cancer--pt is MyRisk neg, no addl screening at this time  No orders of the defined types were placed in this encounter.            GYN counsel adequate intake of calcium and vitamin D, diet and exercise     F/U  No follow-ups on file.  Tywon Niday B. Willamae Demby, PA-C 01/16/2024 8:13 PM

## 2024-01-17 ENCOUNTER — Encounter: Payer: Self-pay | Admitting: Obstetrics and Gynecology

## 2024-01-17 ENCOUNTER — Other Ambulatory Visit (HOSPITAL_COMMUNITY)
Admission: RE | Admit: 2024-01-17 | Discharge: 2024-01-17 | Disposition: A | Discharge status: Home / Self Care | Source: Ambulatory Visit | Attending: Obstetrics and Gynecology | Admitting: Obstetrics and Gynecology

## 2024-01-17 ENCOUNTER — Ambulatory Visit (INDEPENDENT_AMBULATORY_CARE_PROVIDER_SITE_OTHER): Payer: Self-pay | Admitting: Obstetrics and Gynecology

## 2024-01-17 VITALS — BP 137/80 | HR 81 | Ht 62.0 in | Wt 167.0 lb

## 2024-01-17 DIAGNOSIS — Z124 Encounter for screening for malignant neoplasm of cervix: Secondary | ICD-10-CM

## 2024-01-17 DIAGNOSIS — Z803 Family history of malignant neoplasm of breast: Secondary | ICD-10-CM

## 2024-01-17 DIAGNOSIS — Z3041 Encounter for surveillance of contraceptive pills: Secondary | ICD-10-CM

## 2024-01-17 DIAGNOSIS — Z01419 Encounter for gynecological examination (general) (routine) without abnormal findings: Secondary | ICD-10-CM

## 2024-01-17 MED ORDER — NORGESTIMATE-ETH ESTRADIOL 0.25-35 MG-MCG PO TABS: 1.0000 | ORAL_TABLET | Freq: Every day | ORAL | 3 refills | Status: AC

## 2024-01-17 NOTE — Patient Instructions (Signed)
 I value your feedback and you entrusting [place] with your care. If you get a [place] patient survey, I would appreciate you taking the time to let us know about your experience today. Thank you! ? ? ?

## 2024-01-21 LAB — CYTOLOGY - PAP: Diagnosis: NEGATIVE

## 2024-01-30 NOTE — Telephone Encounter (Signed)
 Pt saw [facility] on 01/17/2004 at 1:15 for her annual exam.
